# Patient Record
Sex: Male | Born: 1955
Health system: Southern US, Community
[De-identification: ages and names within clinical notes are randomized; demographics above are authoritative.]

## PROBLEM LIST (undated history)

## (undated) DIAGNOSIS — G4700 Insomnia, unspecified: Secondary | ICD-10-CM

## (undated) DIAGNOSIS — N182 Chronic kidney disease, stage 2 (mild): Secondary | ICD-10-CM

## (undated) DIAGNOSIS — N529 Male erectile dysfunction, unspecified: Secondary | ICD-10-CM

## (undated) DIAGNOSIS — R0683 Snoring: Secondary | ICD-10-CM

## (undated) DIAGNOSIS — I10 Essential (primary) hypertension: Secondary | ICD-10-CM

## (undated) DIAGNOSIS — G4730 Sleep apnea, unspecified: Secondary | ICD-10-CM

## (undated) DIAGNOSIS — T7840XA Allergy, unspecified, initial encounter: Secondary | ICD-10-CM

## (undated) DIAGNOSIS — M1990 Unspecified osteoarthritis, unspecified site: Secondary | ICD-10-CM

## (undated) HISTORY — PX: TONSILLECTOMY: SUR1361

## (undated) HISTORY — DX: Essential (primary) hypertension: I10

## (undated) HISTORY — DX: Allergy, unspecified, initial encounter: T78.40XA

## (undated) HISTORY — DX: Male erectile dysfunction, unspecified: N52.9

## (undated) HISTORY — PX: OTHER SURGICAL HISTORY: SHX169

## (undated) HISTORY — DX: Insomnia, unspecified: G47.00

## (undated) HISTORY — DX: Chronic kidney disease, stage 2 (mild): N18.2

## (undated) HISTORY — DX: Snoring: R06.83

## (undated) HISTORY — PX: POLYPECTOMY: SHX149

## (undated) HISTORY — PX: KNEE ARTHROSCOPY: SHX127

## (undated) HISTORY — DX: Unspecified osteoarthritis, unspecified site: M19.90

## (undated) HISTORY — PX: COLONOSCOPY: SHX174

## (undated) HISTORY — DX: Sleep apnea, unspecified: G47.30

---

## 2008-11-22 ENCOUNTER — Ambulatory Visit: Payer: Self-pay | Admitting: Gastroenterology

## 2008-12-06 ENCOUNTER — Ambulatory Visit: Payer: Self-pay | Admitting: Gastroenterology

## 2008-12-06 ENCOUNTER — Encounter: Payer: Self-pay | Admitting: Gastroenterology

## 2008-12-11 ENCOUNTER — Encounter: Payer: Self-pay | Admitting: Gastroenterology

## 2009-04-22 ENCOUNTER — Encounter: Admission: RE | Admit: 2009-04-22 | Discharge: 2009-04-22 | Payer: Self-pay | Admitting: Occupational Medicine

## 2010-10-01 ENCOUNTER — Other Ambulatory Visit: Payer: Self-pay | Admitting: Family Medicine

## 2011-10-28 ENCOUNTER — Ambulatory Visit (INDEPENDENT_AMBULATORY_CARE_PROVIDER_SITE_OTHER): Payer: 59 | Admitting: Family Medicine

## 2011-10-28 ENCOUNTER — Encounter: Payer: Self-pay | Admitting: Family Medicine

## 2011-10-28 ENCOUNTER — Telehealth: Payer: Self-pay

## 2011-10-28 VITALS — BP 133/83 | HR 64 | Temp 97.2°F | Resp 16 | Ht 72.2 in | Wt 213.0 lb

## 2011-10-28 DIAGNOSIS — Z23 Encounter for immunization: Secondary | ICD-10-CM

## 2011-10-28 DIAGNOSIS — Z0000 Encounter for general adult medical examination without abnormal findings: Secondary | ICD-10-CM

## 2011-10-28 DIAGNOSIS — M25561 Pain in right knee: Secondary | ICD-10-CM

## 2011-10-28 DIAGNOSIS — N529 Male erectile dysfunction, unspecified: Secondary | ICD-10-CM

## 2011-10-28 LAB — POCT URINALYSIS DIPSTICK
Blood, UA: NEGATIVE
Glucose, UA: NEGATIVE
Ketones, UA: NEGATIVE
Leukocytes, UA: NEGATIVE
Nitrite, UA: NEGATIVE
Protein, UA: 30
Spec Grav, UA: >=1.03
Urobilinogen, UA: 0.2
pH, UA: 5.5

## 2011-10-28 LAB — COMPREHENSIVE METABOLIC PANEL
ALT: 22 U/L (ref 0–53)
AST: 32 U/L (ref 0–37)
Albumin: 4.8 g/dL (ref 3.5–5.2)
Alkaline Phosphatase: 46 U/L (ref 39–117)
BUN: 21 mg/dL (ref 6–23)
CO2: 24 mEq/L (ref 19–32)
Calcium: 9.1 mg/dL (ref 8.4–10.5)
Chloride: 108 mEq/L (ref 96–112)
Creat: 1.41 mg/dL — ABNORMAL HIGH (ref 0.50–1.35)
Glucose, Bld: 103 mg/dL — ABNORMAL HIGH (ref 70–99)
Potassium: 4.5 mEq/L (ref 3.5–5.3)
Sodium: 142 mEq/L (ref 135–145)
Total Bilirubin: 0.5 mg/dL (ref 0.3–1.2)
Total Protein: 6.8 g/dL (ref 6.0–8.3)

## 2011-10-28 LAB — IFOBT (OCCULT BLOOD): IFOBT: NEGATIVE

## 2011-10-28 LAB — CBC
HCT: 47.5 % (ref 39.0–52.0)
Hemoglobin: 16 g/dL (ref 13.0–17.0)
MCH: 29.8 pg (ref 26.0–34.0)
MCHC: 33.7 g/dL (ref 30.0–36.0)
MCV: 88.5 fL (ref 78.0–100.0)
Platelets: 285 10*3/uL (ref 150–400)
RBC: 5.37 MIL/uL (ref 4.22–5.81)
RDW: 13 % (ref 11.5–15.5)
WBC: 7.3 10*3/uL (ref 4.0–10.5)

## 2011-10-28 LAB — POCT UA - MICROSCOPIC ONLY
Casts, Ur, LPF, POC: NEGATIVE
Crystals, Ur, HPF, POC: NEGATIVE
Yeast, UA: NEGATIVE

## 2011-10-28 LAB — LIPID PANEL
Cholesterol: 176 mg/dL (ref 0–200)
HDL: 54 mg/dL (ref >39)
LDL Cholesterol: 107 mg/dL — ABNORMAL HIGH (ref 0–99)
Total CHOL/HDL Ratio: 3.3 Ratio
Triglycerides: 76 mg/dL (ref <150)
VLDL: 15 mg/dL (ref 0–40)

## 2011-10-28 LAB — PSA: PSA: 0.73 ng/mL (ref <=4.00)

## 2011-10-28 MED ORDER — TETANUS-DIPHTH-ACELL PERTUSSIS 5-2.5-18.5 LF-MCG/0.5 IM SUSP: 0.5000 mL | Freq: Once | INTRAMUSCULAR | Status: DC

## 2011-10-28 MED ORDER — METHYLPREDNISOLONE 2 MG PO TABS: ORAL_TABLET | ORAL | Status: DC

## 2011-10-28 MED ORDER — TADALAFIL 20 MG PO TABS: 10.0000 mg | ORAL_TABLET | ORAL | Status: DC | PRN

## 2011-10-28 NOTE — Telephone Encounter (Signed)
Pharmacy called stating the methyprednisolone only comes in 4 mg. Dr. Milus Glazier had previously written for 2 mg tabs 1 po bid with 10 tabs being given. We will change this to the 4 mg tabs with the sig being 1 po daily with number 5 given. Please call the pharmacy to make this change.   Eula Listen, PA-C 10/28/2011 8:07 PM

## 2011-10-28 NOTE — Progress Notes (Signed)
Addended by: Tilman Neat on: 10/28/2011 02:26 PM   Modules accepted: Orders

## 2011-10-28 NOTE — Telephone Encounter (Signed)
Can you please advise on this

## 2011-10-28 NOTE — Patient Instructions (Signed)
Health Maintenance, 18- to 58-Year-Old SCHOOL PERFORMANCE After high school completion, the young adult may be attending college, technical or vocational school, or entering the military or the work force. SOCIAL AND EMOTIONAL DEVELOPMENT The young adult establishes adult relationships and explores sexual identity. Young adults may be living at home or in a college dorm or apartment. Increasing independence is important with young adults. Throughout adolescence, teens should assume responsibility of their own health care. IMMUNIZATIONS Most young adults should be fully vaccinated. A booster dose of Tdap (tetanus, diphtheria, and pertussis, or "whooping cough"), a dose of meningococcal vaccine to protect against a certain type of bacterial meningitis, hepatitis A, human papillomarvirus (HPV), chickenpox, or measles vaccines may be indicated, if not given at an earlier age. Annual influenza or "flu" vaccination should be considered during flu season.  TESTING Annual screening for vision and hearing problems is recommended. Vision should be screened objectively at least once between 18 and 58 years of age. The young adult may be screened for anemia or tuberculosis. Young adults should have a blood test to check for high cholesterol during this time period. Young adults should be screened for use of alcohol and drugs. If the young adult is sexually active, screening for sexually transmitted infections, pregnancy, or HIV may be performed. Screening for cervical cancer should be performed within 3 years of beginning sexual activity. NUTRITION AND ORAL HEALTH  Adequate calcium intake is important. Consume 3 servings of low-fat milk and dairy products daily. For those who do not drink milk or consume dairy products, calcium enriched foods, such as juice, bread, or cereal, dark, leafy greens, or canned fish are alternate sources of calcium.   Drink plenty of water. Limit fruit juice to 8 to 12 ounces per day.  Avoid sugary beverages or sodas.   Discourage skipping meals, especially breakfast. Teens should eat a good variety of vegetables and fruits, as well as lean meats.   Avoid high fat, high salt, and high sugar foods, such as candy, chips, and cookies.   Encourage young adults to participate in meal planning and preparation.   Eat meals together as a family whenever possible. Encourage conversation at mealtime.   Limit fast food choices and eating out at restaurants.   Brush teeth twice a day and floss.   Schedule dental exams twice a year.  SLEEP Regular sleep habits are important. PHYSICAL, SOCIAL, AND EMOTIONAL DEVELOPMENT  One hour of regular physical activity daily is recommended. Continue to participate in sports.   Encourage young adults to develop their own interests and consider community service or volunteerism.   Provide guidance to the young adult in making decisions about college and work plans.   Make sure that young adults know that they should never be in a situation that makes them uncomfortable, and they should tell partners if they do not want to engage in sexual activity.   Talk to the young adult about body image. Eating disorders may be noted at this time. Young adults may also be concerned about being overweight. Monitor the young adult for weight gain or loss.   Mood disturbances, depression, anxiety, alcoholism, or attention problems may be noted in young adults. Talk to the caregiver if there are concerns about mental illness.   Negotiate limit setting and independent decision making.   Encourage the young adult to handle conflict without physical violence.   Avoid loud noises which may impair hearing.   Limit television and computer time to 2 hours per day.   Individuals who engage in excessive sedentary activity are more likely to become overweight.  RISK BEHAVIORS  Sexually active young adults need to take precautions against pregnancy and sexually  transmitted infections. Talk to young adults about contraception.   Provide a tobacco-free and drug-free environment for the young adult. Talk to the young adult about drug, tobacco, and alcohol use among friends or at friends' homes. Make sure the young adult knows that smoking tobacco or marijuana and taking drugs have health consequences and may impact brain development.   Teach the young adult about appropriate use of over-the-counter or prescription medicines.   Establish guidelines for driving and for riding with friends.   Talk to young adults about the risks of drinking and driving or boating. Encourage the young adult to call you if he or she or friends have been drinking or using drugs.   Remind young adults to wear seat belts at all times in cars and life vests in boats.   Young adults should always wear a properly fitted helmet when they are riding a bicycle.   Use caution with all-terrain vehicles (ATVs) or other motorized vehicles.   Do not keep handguns in the home. (If you do, the gun and ammunition should be locked separately and out of the young adult's access.)   Equip your home with smoke detectors and change the batteries regularly. Make sure all family members know the fire escape plans for your home.   Teach young adults not to swim alone and not to dive in shallow water.   All individuals should wear sunscreen that protects against UVA and UVB light with at least a sun protection factor (SPF) of 30 when out in the sun. This minimizes sun burning.  WHAT'S NEXT? Young adults should visit their pediatrician or family physician yearly. By young adulthood, health care should be transitioned to a family physician or internal medicine specialist. Sexually active females may want to begin annual physical exams with a gynecologist. Document Released: 09/16/2006 Document Revised: 06/10/2011 Document Reviewed: 10/06/2006 [employer] Patient Information 2012 [employer]. 

## 2011-10-28 NOTE — Telephone Encounter (Signed)
Samantha from [facility] is calling about pt rx for methylpredisone 2mg - it only comes in 4mg  and they also need to know the dosage for pt since it only comes in 4mg  please contact [phone] ext [phone] pt was seen in office today by Dr Milus Glazier

## 2011-10-28 NOTE — Telephone Encounter (Signed)
Addended by: Sondra Barges on: 10/28/2011 08:09 PM   Modules accepted: Orders

## 2011-10-28 NOTE — Progress Notes (Signed)
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  He lives with his wife.   Only complaint today is lateral right patella pain when kneeling on right knee. He probably had with his thumbs last year has clearedand part-time work and being more active. The Voltaren she'll also did help with joint pain.  Last tdap: ? Interested in shingles vaccine since father had bad case of shingles Last colonoscopy:  2010  PMHX:  Reviewed:  Quit cigs 2000 ROS:  Neg F/Hx: Mother had cancer but was heavy smoker  Objective: Very healthy-appearing well-developed middle-aged man in no distress  Eyes: Normal funduscopic, normal EOM, pupils equal and reactive light and accommodation  TMs: normal with normal external ears  Oropharynx: Normal inspection and palpation  Neck: No bruits, no thyromegaly, no adenopathy, supple  Chest: Clear  Heart: Regular no murmur or gallop  Abdomen: Soft nontender without HSM or masses  Adenopathy: None-axilla, neck, supraclavicular, carotids checked  Genitalia: Normal circumcised male  Rectal: Normal prostate without nodules, few external anal tags  Extremities: Mild tenderness lateral right patella with full range of motion of all joints otherwise.  Assessment exceptionally healthy adult male with mild erectile dysfunction and recent patellar tendinitis  Plan: Refill Cialis, Medrol 4 mg daily x5  Check routine labs

## 2011-10-29 NOTE — Telephone Encounter (Signed)
Spoke w/pharmacist and changed strength/sig/# for Rx as written by Alycia Rossetti

## 2012-02-03 ENCOUNTER — Ambulatory Visit (INDEPENDENT_AMBULATORY_CARE_PROVIDER_SITE_OTHER): Payer: 59 | Admitting: Internal Medicine

## 2012-02-03 VITALS — BP 160/100 | HR 73 | Temp 98.3°F | Resp 16 | Ht 73.5 in | Wt 220.4 lb

## 2012-02-03 DIAGNOSIS — I10 Essential (primary) hypertension: Secondary | ICD-10-CM

## 2012-02-03 DIAGNOSIS — G4700 Insomnia, unspecified: Secondary | ICD-10-CM | POA: Insufficient documentation

## 2012-02-03 DIAGNOSIS — N289 Disorder of kidney and ureter, unspecified: Secondary | ICD-10-CM | POA: Insufficient documentation

## 2012-02-03 MED ORDER — ZOLPIDEM TARTRATE 10 MG PO TABS: 10.0000 mg | ORAL_TABLET | Freq: Every evening | ORAL | Status: DC | PRN

## 2012-02-03 MED ORDER — LISINOPRIL 10 MG PO TABS: 10.0000 mg | ORAL_TABLET | Freq: Every day | ORAL | Status: DC

## 2012-02-03 NOTE — Progress Notes (Signed)
  Subjective:    Patient ID: Jorge Barker, male    DOB: Oct 02, 1955, 56 y.o.   MRN: [id number]  HPI BP elevated Needs DOT  Cpe and labs reviewed and all ok Review of Systems     Objective:   Physical Exam  Constitutional: He is oriented to person, place, and time. He appears well-developed and well-nourished.  HENT:  Right Ear: External ear normal.  Left Ear: External ear normal.  Nose: Nose normal.  Mouth/Throat: Oropharynx is clear and moist.  Eyes: EOM are normal. Pupils are equal, round, and reactive to light.  Neck: Normal range of motion. Neck supple. No thyromegaly present.  Cardiovascular: Normal rate, regular rhythm and normal heart sounds.   Pulmonary/Chest: Effort normal.  Abdominal: Soft.  Musculoskeletal: Normal range of motion.  Lymphadenopathy:    He has no cervical adenopathy.  Neurological: He is alert and oriented to person, place, and time. Coordination normal.  Skin: Skin is warm and dry.  Psychiatric: He has a normal mood and affect. His behavior is normal. Judgment and thought content normal.   164/90  156/104       Assessment & Plan:  HBP tx started

## 2012-02-03 NOTE — Patient Instructions (Signed)
Insomnia Insomnia is frequent trouble falling and/or staying asleep. Insomnia can be a long term problem or a short term problem. Both are common. Insomnia can be a short term problem when the wakefulness is related to a certain stress or worry. Long term insomnia is often related to ongoing stress during waking hours and/or poor sleeping habits. Overtime, sleep deprivation itself can make the problem worse. Every little thing feels more severe because you are overtired and your ability to cope is decreased. CAUSES   Stress, anxiety, and depression.   Poor sleeping habits.   Distractions such as TV in the bedroom.   Naps close to bedtime.   Engaging in emotionally charged conversations before bed.   Technical reading before sleep.   Alcohol and other sedatives. They may make the problem worse. They can hurt normal sleep patterns and normal dream activity.   Stimulants such as caffeine for several hours prior to bedtime.   Pain syndromes and shortness of breath can cause insomnia.   Exercise late at night.   Changing time zones may cause sleeping problems (jet lag).  It is sometimes helpful to have someone observe your sleeping patterns. They should look for periods of not breathing during the night (sleep apnea). They should also look to see how long those periods last. If you live alone or observers are uncertain, you can also be observed at a sleep clinic where your sleep patterns will be professionally monitored. Sleep apnea requires a checkup and treatment. Give your caregivers your medical history. Give your caregivers observations your family has made about your sleep.  SYMPTOMS   Not feeling rested in the morning.   Anxiety and restlessness at bedtime.   Difficulty falling and staying asleep.  TREATMENT   Your caregiver may prescribe treatment for an underlying medical disorders. Your caregiver can give advice or help if you are using alcohol or other drugs for  self-medication. Treatment of underlying problems will usually eliminate insomnia problems.   Medications can be prescribed for short time use. They are generally not recommended for lengthy use.   Over-the-counter sleep medicines are not recommended for lengthy use. They can be habit forming.   You can promote easier sleeping by making lifestyle changes such as:   Using relaxation techniques that help with breathing and reduce muscle tension.   Exercising earlier in the day.   Changing your diet and the time of your last meal. No night time snacks.   Establish a regular time to go to bed.   Counseling can help with stressful problems and worry.   Soothing music and white noise may be helpful if there are background noises you cannot remove.   Stop tedious detailed work at least one hour before bedtime.  HOME CARE INSTRUCTIONS   Keep a diary. Inform your caregiver about your progress. This includes any medication side effects. See your caregiver regularly. Take note of:   Times when you are asleep.   Times when you are awake during the night.   The quality of your sleep.   How you feel the next day.  This information will help your caregiver care for you.  Get out of bed if you are still awake after 15 minutes. Read or do some quiet activity. Keep the lights down. Wait until you feel sleepy and go back to bed.   Keep regular sleeping and waking hours. Avoid naps.   Exercise regularly.   Avoid distractions at bedtime. Distractions include watching television or engaging   in any intense or detailed activity like attempting to balance the household checkbook.   Develop a bedtime ritual. Keep a familiar routine of bathing, brushing your teeth, climbing into bed at the same time each night, listening to soothing music. Routines increase the success of falling to sleep faster.   Use relaxation techniques. This can be using breathing and muscle tension release routines. It can  also include visualizing peaceful scenes. You can also help control troubling or intruding thoughts by keeping your mind occupied with boring or repetitive thoughts like the old concept of counting sheep. You can make it more creative like imagining planting one beautiful flower after another in your backyard garden.   During your day, work to eliminate stress. When this is not possible use some of the previous suggestions to help reduce the anxiety that accompanies stressful situations.  MAKE SURE YOU:   Understand these instructions.   Will watch your condition.   Will get help right away if you are not doing well or get worse.  Document Released: 06/18/2000 Document Revised: 06/10/2011 Document Reviewed: 07/19/2007 [facility] Patient Information 2012 [place], [place].Hypertension As your heart beats, it forces blood through your arteries. This force is your blood pressure. If the pressure is too high, it is called hypertension (HTN) or high blood pressure. HTN is dangerous because you may have it and not know it. High blood pressure may mean that your heart has to work harder to pump blood. Your arteries may be narrow or stiff. The extra work puts you at risk for heart disease, stroke, and other problems.  Blood pressure consists of two numbers, a higher number over a lower, 110/72, for example. It is stated as "110 over 72." The ideal is below 120 for the top number (systolic) and under 80 for the bottom (diastolic). Write down your blood pressure today. You should pay close attention to your blood pressure if you have certain conditions such as:  Heart failure.   Prior heart attack.   Diabetes   Chronic kidney disease.   Prior stroke.   Multiple risk factors for heart disease.  To see if you have HTN, your blood pressure should be measured while you are seated with your arm held at the level of the heart. It should be measured at least twice. A one-time elevated blood pressure reading  (especially in the Emergency Department) does not mean that you need treatment. There may be conditions in which the blood pressure is different between your right and left arms. It is important to see your caregiver soon for a recheck. Most people have essential hypertension which means that there is not a specific cause. This type of high blood pressure may be lowered by changing lifestyle factors such as:  Stress.   Smoking.   Lack of exercise.   Excessive weight.   Drug/tobacco/alcohol use.   Eating less salt.  Most people do not have symptoms from high blood pressure until it has caused damage to the body. Effective treatment can often prevent, delay or reduce that damage. TREATMENT  When a cause has been identified, treatment for high blood pressure is directed at the cause. There are a large number of medications to treat HTN. These fall into several categories, and your caregiver will help you select the medicines that are best for you. Medications may have side effects. You should review side effects with your caregiver. If your blood pressure stays high after you have made lifestyle changes or started on medicines,  Your medication(s) may need to be changed.   Other problems may need to be addressed.   Be certain you understand your prescriptions, and know how and when to take your medicine.   Be sure to follow up with your caregiver within the time frame advised (usually within two weeks) to have your blood pressure rechecked and to review your medications.   If you are taking more than one medicine to lower your blood pressure, make sure you know how and at what times they should be taken. Taking two medicines at the same time can result in blood pressure that is too low.  SEEK IMMEDIATE MEDICAL CARE IF:  You develop a severe headache, blurred or changing vision, or confusion.   You have unusual weakness or numbness, or a faint feeling.   You have severe chest or  abdominal pain, vomiting, or breathing problems.  MAKE SURE YOU:   Understand these instructions.   Will watch your condition.   Will get help right away if you are not doing well or get worse.  Document Released: 06/21/2005 Document Revised: 06/10/2011 Document Reviewed: 02/09/2008 [facility] Patient Information 2012 [place], [place].

## 2013-03-08 ENCOUNTER — Encounter: Payer: Self-pay | Admitting: Family Medicine

## 2013-03-08 ENCOUNTER — Ambulatory Visit (INDEPENDENT_AMBULATORY_CARE_PROVIDER_SITE_OTHER): Payer: 59 | Admitting: Family Medicine

## 2013-03-08 VITALS — BP 144/100 | HR 67 | Temp 98.4°F | Resp 16 | Ht 73.0 in | Wt 219.4 lb

## 2013-03-08 DIAGNOSIS — H60509 Unspecified acute noninfective otitis externa, unspecified ear: Secondary | ICD-10-CM

## 2013-03-08 DIAGNOSIS — G4700 Insomnia, unspecified: Secondary | ICD-10-CM

## 2013-03-08 DIAGNOSIS — R0609 Other forms of dyspnea: Secondary | ICD-10-CM

## 2013-03-08 DIAGNOSIS — N529 Male erectile dysfunction, unspecified: Secondary | ICD-10-CM

## 2013-03-08 DIAGNOSIS — R0989 Other specified symptoms and signs involving the circulatory and respiratory systems: Secondary | ICD-10-CM

## 2013-03-08 DIAGNOSIS — R0683 Snoring: Secondary | ICD-10-CM

## 2013-03-08 DIAGNOSIS — H60543 Acute eczematoid otitis externa, bilateral: Secondary | ICD-10-CM

## 2013-03-08 DIAGNOSIS — Z0000 Encounter for general adult medical examination without abnormal findings: Secondary | ICD-10-CM

## 2013-03-08 LAB — CBC WITH DIFFERENTIAL/PLATELET
Basophils Absolute: 0 10*3/uL (ref 0.0–0.1)
Basophils Relative: 0 % (ref 0–1)
Eosinophils Absolute: 0.1 10*3/uL (ref 0.0–0.7)
Eosinophils Relative: 2 % (ref 0–5)
HCT: 45.3 % (ref 39.0–52.0)
Hemoglobin: 16.1 g/dL (ref 13.0–17.0)
Lymphocytes Relative: 25 % (ref 12–46)
Lymphs Abs: 1.7 10*3/uL (ref 0.7–4.0)
MCH: 30.1 pg (ref 26.0–34.0)
MCHC: 35.5 g/dL (ref 30.0–36.0)
MCV: 84.7 fL (ref 78.0–100.0)
Monocytes Absolute: 0.7 10*3/uL (ref 0.1–1.0)
Monocytes Relative: 10 % (ref 3–12)
Neutro Abs: 4.4 10*3/uL (ref 1.7–7.7)
Neutrophils Relative %: 63 % (ref 43–77)
Platelets: 291 10*3/uL (ref 150–400)
RBC: 5.35 MIL/uL (ref 4.22–5.81)
RDW: 14.1 % (ref 11.5–15.5)
WBC: 7.1 10*3/uL (ref 4.0–10.5)

## 2013-03-08 LAB — PSA: PSA: 1.05 ng/mL (ref <=4.00)

## 2013-03-08 LAB — COMPREHENSIVE METABOLIC PANEL
ALT: 38 U/L (ref 0–53)
AST: 74 U/L — ABNORMAL HIGH (ref 0–37)
Albumin: 4.8 g/dL (ref 3.5–5.2)
Alkaline Phosphatase: 53 U/L (ref 39–117)
BUN: 15 mg/dL (ref 6–23)
CO2: 29 mEq/L (ref 19–32)
Calcium: 9.8 mg/dL (ref 8.4–10.5)
Chloride: 104 mEq/L (ref 96–112)
Creat: 1.19 mg/dL (ref 0.50–1.35)
Glucose, Bld: 103 mg/dL — ABNORMAL HIGH (ref 70–99)
Potassium: 4.4 mEq/L (ref 3.5–5.3)
Sodium: 139 mEq/L (ref 135–145)
Total Bilirubin: 1 mg/dL (ref 0.3–1.2)
Total Protein: 6.9 g/dL (ref 6.0–8.3)

## 2013-03-08 LAB — POCT URINALYSIS DIPSTICK
Bilirubin, UA: NEGATIVE
Blood, UA: NEGATIVE
Glucose, UA: NEGATIVE
Ketones, UA: NEGATIVE
Leukocytes, UA: NEGATIVE
Nitrite, UA: NEGATIVE
Protein, UA: NEGATIVE
Spec Grav, UA: 1.02
Urobilinogen, UA: 0.2
pH, UA: 5.5

## 2013-03-08 LAB — LIPID PANEL
Cholesterol: 157 mg/dL (ref 0–200)
HDL: 45 mg/dL (ref >39)
LDL Cholesterol: 83 mg/dL (ref 0–99)
Total CHOL/HDL Ratio: 3.5 Ratio
Triglycerides: 144 mg/dL (ref <150)
VLDL: 29 mg/dL (ref 0–40)

## 2013-03-08 LAB — IFOBT (OCCULT BLOOD): IFOBT: NEGATIVE

## 2013-03-08 LAB — TSH: TSH: 1.78 u[IU]/mL (ref 0.350–4.500)

## 2013-03-08 MED ORDER — TADALAFIL 20 MG PO TABS: 10.0000 mg | ORAL_TABLET | ORAL | Status: DC | PRN

## 2013-03-08 MED ORDER — ZOLPIDEM TARTRATE 10 MG PO TABS: 10.0000 mg | ORAL_TABLET | Freq: Every evening | ORAL | Status: DC | PRN

## 2013-03-08 MED ORDER — TRIAMCINOLONE ACETONIDE 0.1 % EX CREA: TOPICAL_CREAM | CUTANEOUS | Status: DC

## 2013-03-08 NOTE — Patient Instructions (Addendum)
Health Maintenance, Males A healthy lifestyle and preventative care can promote health and wellness.  Maintain regular health, dental, and eye exams.  Eat a healthy diet. Foods like vegetables, fruits, whole grains, low-fat dairy products, and lean protein foods contain the nutrients you need without too many calories. Decrease your intake of foods high in solid fats, added sugars, and salt. Get information about a proper diet from your caregiver, if necessary.  Regular physical exercise is one of the most important things you can do for your health. Most adults should get at least 150 minutes of moderate-intensity exercise (any activity that increases your heart rate and causes you to sweat) each week. In addition, most adults need muscle-strengthening exercises on 2 or more days a week.   Maintain a healthy weight. The body mass index (BMI) is a screening tool to identify possible weight problems. It provides an estimate of body fat based on height and weight. Your caregiver can help determine your BMI, and can help you achieve or maintain a healthy weight. For adults 20 years and older:  A BMI below 18.5 is considered underweight.  A BMI of 18.5 to 24.9 is normal.  A BMI of 25 to 29.9 is considered overweight.  A BMI of 30 and above is considered obese.  Maintain normal blood lipids and cholesterol by exercising and minimizing your intake of saturated fat. Eat a balanced diet with plenty of fruits and vegetables. Blood tests for lipids and cholesterol should begin at age 20 and be repeated every 5 years. If your lipid or cholesterol levels are high, you are over 50, or you are a high risk for heart disease, you may need your cholesterol levels checked more frequently.Ongoing high lipid and cholesterol levels should be treated with medicines, if diet and exercise are not effective.  If you smoke, find out from your caregiver how to quit. If you do not use tobacco, do not start.  If you  choose to drink alcohol, do not exceed 2 drinks per day. One drink is considered to be 12 ounces (355 mL) of beer, 5 ounces (148 mL) of wine, or 1.5 ounces (44 mL) of liquor.  Avoid use of street drugs. Do not share needles with anyone. Ask for help if you need support or instructions about stopping the use of drugs.  High blood pressure causes heart disease and increases the risk of stroke. Blood pressure should be checked at least every 1 to 2 years. Ongoing high blood pressure should be treated with medicines if weight loss and exercise are not effective.  If you are 45 to 59 years old, ask your caregiver if you should take aspirin to prevent heart disease.  Diabetes screening involves taking a blood sample to check your fasting blood sugar level. This should be done once every 3 years, after age 45, if you are within normal weight and without risk factors for diabetes. Testing should be considered at a younger age or be carried out more frequently if you are overweight and have at least 1 risk factor for diabetes.  Colorectal cancer can be detected and often prevented. Most routine colorectal cancer screening begins at the age of 50 and continues through age 75. However, your caregiver may recommend screening at an earlier age if you have risk factors for colon cancer. On a yearly basis, your caregiver may provide home test kits to check for hidden blood in the stool. Use of a small camera at the end of a tube,   to directly examine the colon (sigmoidoscopy or colonoscopy), can detect the earliest forms of colorectal cancer. Talk to your caregiver about this at age 50, when routine screening begins. Direct examination of the colon should be repeated every 5 to 10 years through age 75, unless early forms of pre-cancerous polyps or small growths are found.  Hepatitis C blood testing is recommended for all people born from 1945 through 1965 and any individual with known risks for hepatitis C.  Healthy  men should no longer receive prostate-specific antigen (PSA) blood tests as part of routine cancer screening. Consult with your caregiver about prostate cancer screening.  Testicular cancer screening is not recommended for adolescents or adult males who have no symptoms. Screening includes self-exam, caregiver exam, and other screening tests. Consult with your caregiver about any symptoms you have or any concerns you have about testicular cancer.  Practice safe sex. Use condoms and avoid high-risk sexual practices to reduce the spread of sexually transmitted infections (STIs).  Use sunscreen with a sun protection factor (SPF) of 30 or greater. Apply sunscreen liberally and repeatedly throughout the day. You should seek shade when your shadow is shorter than you. Protect yourself by wearing long sleeves, pants, a wide-brimmed hat, and sunglasses year round, whenever you are outdoors.  Notify your caregiver of new moles or changes in moles, especially if there is a change in shape or color. Also notify your caregiver if a mole is larger than the size of a pencil eraser.  A one-time screening for abdominal aortic aneurysm (AAA) and surgical repair of large AAAs by sound wave imaging (ultrasonography) is recommended for ages 65 to 75 years who are current or former smokers.  Stay current with your immunizations. Document Released: 12/18/2007 Document Revised: 09/13/2011 Document Reviewed: 11/16/2010 [employer] Patient Information 2014 [employer].  

## 2013-03-08 NOTE — Progress Notes (Signed)
  Subjective:    Patient ID: Jorge Barker, male    DOB: 1956/01/25, 57 y.o.   MRN: [id number]  HPI Sleep is a problem.  Snoring with observed apneic spells.  Feels tired during the day. Walking 3 miles a day. 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 Married.  3rd grandchild in July  Review of Systems  Constitutional: Negative.   HENT: Negative.   Eyes: Negative.   Respiratory: Negative.   Cardiovascular: Negative.   Gastrointestinal: Negative.   Endocrine: Negative.   Genitourinary: Negative.   Musculoskeletal: Negative.   Skin: Negative.   Allergic/Immunologic: Negative.   Neurological: Negative.   Hematological: Negative.   Psychiatric/Behavioral: Negative.        Objective:   Physical Exam160/100 NAD, appears fit HEENT:  Normal with multiple crowns Neck:  Supple, no adenop, no bruit, no thyromegaly Chest:  Clear, non tender Heart: reg, no murmur, no gallop Abdomen:  Soft, nontender with no HSM Genitalia:  Normal Rectal:  Normal Extremities: normal pulses, normal appearance with no edema Skin:  clear     Assessment & Plan:  Annual physical exam - Plan: CBC with Differential, Comprehensive metabolic panel, POCT urinalysis dipstick, Lipid panel, PSA, TSH, IFOBT POC (occult bld, rslt in office), Varicella-zoster vaccine subcutaneous  Snoring - Plan: Nocturnal polysomnography (NPSG), zolpidem (AMBIEN) 10 MG tablet  Insomnia - Plan: zolpidem (AMBIEN) 10 MG tablet  Erectile dysfunction - Plan: tadalafil (CIALIS) 20 MG tablet  Eczema of external ear, bilateral - Plan: triamcinolone cream (KENALOG) 0.1 %  Signed, Elvina Sidle, MD

## 2013-03-12 ENCOUNTER — Telehealth: Payer: Self-pay | Admitting: Radiology

## 2013-03-12 ENCOUNTER — Telehealth: Payer: Self-pay | Admitting: Neurology

## 2013-03-12 ENCOUNTER — Other Ambulatory Visit: Payer: Self-pay | Admitting: Neurology

## 2013-03-12 ENCOUNTER — Encounter: Payer: Self-pay | Admitting: Family Medicine

## 2013-03-12 DIAGNOSIS — R0683 Snoring: Secondary | ICD-10-CM

## 2013-03-12 DIAGNOSIS — R5381 Other malaise: Secondary | ICD-10-CM

## 2013-03-12 DIAGNOSIS — G4710 Hypersomnia, unspecified: Secondary | ICD-10-CM

## 2013-03-12 NOTE — Telephone Encounter (Signed)
refers patient for attended sleep study: Dr. Milus Glazier   Height: 6'1  Weight: 219 lb   BMI: 28.95  Past Medical History: Hypertension, Renal insufficiency, Snoring, Erectile dysfunction and Eczema of external ear, bilateral  Sleep Symptoms: Snoring with observed apneic spells and feels tired during the day  Epworth Score: 12. Patient stated after he has lunch, he would go to his den and sit and if there's music playing he would doze off for 45 minutes  Medications: Glucosamine-Chondroitin GLUCOSAMINE-CHONDROITIN PO Take by mouth daily. Lisinopril (Tab) PRINIVIL,ZESTRIL 10 MG Take 1 tablet (10 mg total) by mouth daily. Omega-3 Fatty Acids (Cap) fish oil-omega-3 fatty acids 1000 MG Take 1 g by mouth daily. Tadalafil (Tab) CIALIS 20 MG Take 0.5-1 tablets (10-20 mg total) by mouth every other day as needed for erectile dysfunction. Triamcinolone Acetonide (Cream) KENALOG 0.1 % Twice daily prn ear itching Zinc (Tab) Zinc 50 MG Take by mouth daily. Zolpidem Tartrate (Tab) AMBIEN 10 MG Take 1 tablet (10 mg total) by mouth at bedtime as needed for sleep.   Insurance: [employer] will cover at 100% but authorization is required.  Please review patient information and submit instructions for scheduling and orders for sleep technologist.  Thank you!

## 2013-03-12 NOTE — Telephone Encounter (Signed)
[facility] has gotten order faxed for sleep study, but this is not in epic, have put in epic. Unclear how order was sent without being put in epic.

## 2013-03-12 NOTE — Telephone Encounter (Signed)
This patient qualifies for attended sleep study based on clinical history. Overweight, no morbidly obese. HTN , ED, insomnia.   Patient will need a SPLIT at AHI 15 and  3% scoring.  patient to take their own sleep aid if they have oe.    CO2 necessary.

## 2013-03-14 NOTE — Telephone Encounter (Signed)
I called and spoke with the patient and he will callback this morning to schedule his sleep study.

## 2013-03-27 ENCOUNTER — Telehealth: Payer: Self-pay | Admitting: Family Medicine

## 2013-03-27 NOTE — Telephone Encounter (Signed)
LMOM to see if patient has checked his MY CHART and read DR Lauensteins message

## 2013-04-03 DIAGNOSIS — G2589 Other specified extrapyramidal and movement disorders: Secondary | ICD-10-CM

## 2013-04-03 DIAGNOSIS — G4700 Insomnia, unspecified: Secondary | ICD-10-CM

## 2013-04-03 DIAGNOSIS — G4730 Sleep apnea, unspecified: Secondary | ICD-10-CM

## 2013-04-03 DIAGNOSIS — G4733 Obstructive sleep apnea (adult) (pediatric): Secondary | ICD-10-CM

## 2013-04-04 ENCOUNTER — Ambulatory Visit (INDEPENDENT_AMBULATORY_CARE_PROVIDER_SITE_OTHER): Payer: Self-pay | Admitting: Neurology

## 2013-04-04 DIAGNOSIS — R0683 Snoring: Secondary | ICD-10-CM

## 2013-04-04 DIAGNOSIS — G4710 Hypersomnia, unspecified: Secondary | ICD-10-CM

## 2013-04-04 DIAGNOSIS — Z0289 Encounter for other administrative examinations: Secondary | ICD-10-CM

## 2013-04-13 ENCOUNTER — Encounter: Payer: Self-pay | Admitting: *Deleted

## 2013-04-13 ENCOUNTER — Telehealth: Payer: Self-pay | Admitting: Neurology

## 2013-04-13 DIAGNOSIS — G2589 Other specified extrapyramidal and movement disorders: Secondary | ICD-10-CM

## 2013-04-13 DIAGNOSIS — G4733 Obstructive sleep apnea (adult) (pediatric): Secondary | ICD-10-CM

## 2013-04-13 DIAGNOSIS — G4700 Insomnia, unspecified: Secondary | ICD-10-CM

## 2013-04-13 NOTE — Telephone Encounter (Signed)
Called patient to discuss sleep study results.  Discussed findings, recommendations and follow up care.  Patient understood well and all questions were answered.  Patient has chosen [employer] as his DME Company. I will mail sleep study results to the patient and fax a copy to Dr. Loma Boston office.

## 2013-05-10 ENCOUNTER — Other Ambulatory Visit: Payer: Self-pay

## 2013-06-04 ENCOUNTER — Encounter: Payer: Self-pay | Admitting: Neurology

## 2013-06-04 ENCOUNTER — Ambulatory Visit (INDEPENDENT_AMBULATORY_CARE_PROVIDER_SITE_OTHER): Payer: 59 | Admitting: Neurology

## 2013-06-04 VITALS — BP 157/96 | HR 65 | Resp 16 | Ht 74.0 in | Wt 217.0 lb

## 2013-06-04 DIAGNOSIS — G4730 Sleep apnea, unspecified: Secondary | ICD-10-CM

## 2013-06-04 DIAGNOSIS — G4710 Hypersomnia, unspecified: Secondary | ICD-10-CM

## 2013-06-04 DIAGNOSIS — G4733 Obstructive sleep apnea (adult) (pediatric): Secondary | ICD-10-CM

## 2013-06-04 DIAGNOSIS — N529 Male erectile dysfunction, unspecified: Secondary | ICD-10-CM | POA: Insufficient documentation

## 2013-06-04 DIAGNOSIS — R0683 Snoring: Secondary | ICD-10-CM | POA: Insufficient documentation

## 2013-06-04 HISTORY — DX: Sleep apnea, unspecified: G47.30

## 2013-06-04 NOTE — Progress Notes (Signed)
[facility]  Provider:  Melvyn Novas, M D  Referring Provider: Elvina Sidle, MD Primary Care Physician:  Elvina Sidle, MD  Chief Complaint  Patient presents with  . New Evaluation    sleep consult, rm 11    HPI:  Jorge Barker is a 57 y.o. male  is seen here as a referral  from Dr. Milus Glazier for evaluation of insomnia and snoring.   Mr. Holeman was originally referred directly for a sleep study by his primary care physician. The concerns addressed were that of nonrestorative sleep, insomnia and hypertension. It is also important that Mr. Debord's wife reported loud snoring and witnessed apneas. 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  He has started to use power naps  After his 41 birthday to get refreshed, he couldn't functio well without this once a day 30 minute nap.  He works out regularly, works part time as a Chartered loss adjuster.  His past medical history was endorsed for hypertension, renal insufficiency, osteoarthritis, shiftworker history, insomnia with excessive daytime sleepiness and loud snoring. Witnessed apneas. The patient endorsed the Epworth sleepiness score of 16/24 points. His BMI was 28.9, his neck circumference 16.5 inches. Blood pressure measured prestudy was elevated at 165/98 mm Hg.   The sleep study was performed on 04-04-13 the baseline study showed an AHI of 27.4 and an RDI of 28.4 during REM sleep the AHI was further elevated to 35.5 events per hour of sleep interestingly supine AHI was only 27. Lowest oxygen nadir was 82% was only 7.4 minutes of desaturations there were frequent periodic limb movements but almost normal related arousals. CPAP was  initiated at 4 cm water and needed only to be increased to 6 cm water pressure unfortunately the PET patient's periodic limb movements accelerated under CPAP he was but his oxygen nadir now rose to  91%.   The patient reports that he has used CPAP at home but wakes regularly now at about 3. 30 - 4 in the morning. He is wide awake - feels ready to go .  Sometimes it very difficult for him to reinitiate sleep. 1 his wife reports that he sleeps better now and especially there is no breakthrough snoring or apnea witnessed anymore he is not sure is that he is tolerating CPAP that well. In the last month he has only taken 4 naps.  He has a runny nose when using humidity,  the mask is not bothering him and as a fireman he was used to we are respiratory gear. There is no claustrophobia. Nasal pillows were used as he has  facial hair.  The PLMs were decreased per wife report. Epworth score now 6 from previously 16 points. The patient bikes for exercise regularly and noticed over the last month an increase in exercise tolerance from about 50 miles to 20 mild biking without being exhausted. CPAP data download show that the patient has used machine 10 days with an average time in therapy 7 hours and 22 minutes, residual AHI is 0.3. There is a moderate air leak. The setting of 6 cm water pressure with a 1 cm release.    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 Wakes up in AM spontaneously- not using alarm.  Rises at 6.00 AM,  7 hours of overall sleep , used to have nocturia 3 times at night, none now.  No longer waking with a dry mouth , and as reported above he no longer needs the power naps.      Review of Systems: Out of a complete 14 system review, the patient complains of only the following symptoms, and all other reviewed systems are negative. Epworth from 16 to 6 points , CPAP at 6 cm.   History   Social  History  . Marital Status: Married    Spouse Name: N/A    Number of Children: 2  . Years of Education: assoc.   Occupational History  . drive bus part time    Social History Main Topics  . Smoking status: Former Smoker -- 12 years    Types: Cigarettes    Quit date: 07/05/1981  . Smokeless tobacco: Never Used  . Alcohol Use: Yes     Comment: drinks beer and spirit (RUM)/10oz weekly  . Drug Use: No  . Sexual Activity: Yes   Other Topics Concern  . Not on file   Social History Narrative   Married. Education: [employer]. Exercise: Walk daily 30-60 minutes. Patient is right handed and resides in home with wife.  Consumes 1-2 cups of caffiene daily.    History reviewed. No pertinent family history.  Past Medical History  Diagnosis Date  . Erectile dysfunction   . Snoring     History reviewed. No pertinent past surgical history.  Current Outpatient Prescriptions  Medication Sig Dispense Refill  . tadalafil (CIALIS) 20 MG tablet Take 0.5-1 tablets (10-20 mg total) by mouth every other day as needed for erectile dysfunction.  10 tablet  11  . triamcinolone cream (KENALOG) 0.1 % Twice daily prn ear itching  30 g  0  . zolpidem (AMBIEN) 10 MG tablet Take 1 tablet (10 mg total) by mouth at bedtime as needed for sleep.  30 tablet  1   No current facility-administered medications for this visit.    Allergies as of 06/04/2013 - Review Complete 06/04/2013  Allergen Reaction Noted  . Citalopram hydrobromide  10/28/2011  . Influenza vaccines  10/28/2011  . Oxycodone-acetaminophen  11/22/2008    Vitals: BP 157/96  Pulse 65  Resp 16  Ht 6\' 2"  (1.88 m)  Wt 217 lb (98.431 kg)  BMI 27.85 kg/m2 Last Weight:  Wt Readings from Last 1 Encounters:  06/04/13 217 lb (98.431 kg)   Last Height:   Ht Readings from Last 1 Encounters:  06/04/13 6\' 2"  (1.88 m)   Physical exam:  General: The patient is awake, alert and appears not in acute distress. The patient is well groomed. Head:  Normocephalic, atraumatic. Neck is supple. Mallampati 3  neck circumference:16.25 ,  Cardiovascular:  Regular rate and rhythm , without  murmurs or carotid bruit, and without distended neck veins. Respiratory: Lungs are clear to auscultation. Skin:  Without evidence of edema, or rash Trunk: BMI is normal.  Neurologic exam : The patient is awake and alert, oriented to place and time.  Memory subjective described as intact.  There is a normal attention span & concentration ability. Speech is fluent without  dysarthria, dysphonia or aphasia.  Mood and affect are appropriate.  Cranial nerves: Pupils are equal and briskly reactive to light. Funduscopic exam  without   evidence of pallor or edema. Extraocular movements  in vertical and horizontal planes intact and without nystagmus. Visual fields by finger perimetry are intact. Hearing to finger rub intact.  Facial sensation intact to fine touch. Facial motor strength is symmetric and tongue and uvula move midline.  Motor exam:   Normal tone and normal muscle bulk and symmetric normal strength in all extremities.  Sensory:  Fine touch, pinprick and vibration were tested in all extremities. Proprioception is normal.  Coordination: Rapid alternating movements in the fingers/hands is tested and normal. Finger-to-nose maneuver tested and normal without evidence of ataxia, dysmetria or tremor.  Gait and station: Patient walks without assistive device . Strength within normal limits. Stance is stable and normal. Tandem gait is unfragmented. Romberg testing is normal.  Deep tendon reflexes: in the  upper and lower extremities are symmetric and intact. Babinski  downgoing.   Assessment:  After physical and neurologic examination, review of laboratory studies, imaging, neurophysiology testing and pre-existing records, assessment is 1) OSA . Moderate severity - controlled on CPAP 6 cm water, nasal pillow.  2)Insomnia , nocturia and hypersomia all  resolved.   Plan:  Treatment plan and additional workup : 1) continue CPAP use , will follow in 6 month , than yearly.

## 2013-06-04 NOTE — Patient Instructions (Signed)

## 2013-06-14 ENCOUNTER — Encounter: Payer: Self-pay | Admitting: Neurology

## 2013-11-03 ENCOUNTER — Encounter: Payer: Self-pay | Admitting: Gastroenterology

## 2013-11-04 ENCOUNTER — Encounter: Payer: Self-pay | Admitting: Family Medicine

## 2013-11-05 ENCOUNTER — Other Ambulatory Visit: Payer: Self-pay | Admitting: Family Medicine

## 2013-11-05 DIAGNOSIS — G4700 Insomnia, unspecified: Secondary | ICD-10-CM

## 2013-11-05 DIAGNOSIS — R0683 Snoring: Secondary | ICD-10-CM

## 2013-11-05 MED ORDER — ZOLPIDEM TARTRATE 10 MG PO TABS: 10.0000 mg | ORAL_TABLET | Freq: Every evening | ORAL | Status: DC | PRN

## 2013-11-06 ENCOUNTER — Other Ambulatory Visit: Payer: Self-pay | Admitting: Radiology

## 2013-11-06 NOTE — Telephone Encounter (Signed)
Faxed Ambien 

## 2013-11-09 ENCOUNTER — Encounter: Payer: Self-pay | Admitting: Gastroenterology

## 2013-12-05 ENCOUNTER — Ambulatory Visit: Payer: 59 | Admitting: Neurology

## 2013-12-27 ENCOUNTER — Telehealth: Payer: Self-pay

## 2013-12-27 NOTE — Telephone Encounter (Signed)
Patient wants to know if Dr. Joseph Art called in shingles vaccine last year. He want to get one but is not sure if he another one called in or if the one called in before is still available.   [phone]

## 2013-12-28 NOTE — Telephone Encounter (Signed)
Patient needs rx reprinted. I have pended order.

## 2014-02-18 ENCOUNTER — Encounter: Payer: Self-pay | Admitting: Family Medicine

## 2014-02-18 ENCOUNTER — Ambulatory Visit (INDEPENDENT_AMBULATORY_CARE_PROVIDER_SITE_OTHER): Payer: 59 | Admitting: Family Medicine

## 2014-02-18 VITALS — BP 172/94 | HR 64 | Temp 98.2°F | Resp 16 | Ht 73.25 in | Wt 228.0 lb

## 2014-02-18 DIAGNOSIS — N528 Other male erectile dysfunction: Secondary | ICD-10-CM

## 2014-02-18 DIAGNOSIS — R0683 Snoring: Secondary | ICD-10-CM

## 2014-02-18 DIAGNOSIS — Z0000 Encounter for general adult medical examination without abnormal findings: Secondary | ICD-10-CM

## 2014-02-18 DIAGNOSIS — Z7189 Other specified counseling: Secondary | ICD-10-CM

## 2014-02-18 DIAGNOSIS — G4700 Insomnia, unspecified: Secondary | ICD-10-CM

## 2014-02-18 DIAGNOSIS — R0609 Other forms of dyspnea: Secondary | ICD-10-CM

## 2014-02-18 DIAGNOSIS — N529 Male erectile dysfunction, unspecified: Secondary | ICD-10-CM

## 2014-02-18 DIAGNOSIS — R0989 Other specified symptoms and signs involving the circulatory and respiratory systems: Secondary | ICD-10-CM

## 2014-02-18 DIAGNOSIS — Z7184 Encounter for health counseling related to travel: Secondary | ICD-10-CM

## 2014-02-18 LAB — COMPREHENSIVE METABOLIC PANEL
ALT: 25 U/L (ref 0–53)
AST: 24 U/L (ref 0–37)
Albumin: 4.4 g/dL (ref 3.5–5.2)
Alkaline Phosphatase: 47 U/L (ref 39–117)
BUN: 14 mg/dL (ref 6–23)
CO2: 25 mEq/L (ref 19–32)
Calcium: 8.9 mg/dL (ref 8.4–10.5)
Chloride: 103 mEq/L (ref 96–112)
Creat: 1.39 mg/dL — ABNORMAL HIGH (ref 0.50–1.35)
Glucose, Bld: 105 mg/dL — ABNORMAL HIGH (ref 70–99)
Potassium: 4.3 mEq/L (ref 3.5–5.3)
Sodium: 138 mEq/L (ref 135–145)
Total Bilirubin: 0.9 mg/dL (ref 0.2–1.2)
Total Protein: 6.8 g/dL (ref 6.0–8.3)

## 2014-02-18 LAB — POCT URINALYSIS DIPSTICK
Bilirubin, UA: NEGATIVE
Blood, UA: NEGATIVE
Glucose, UA: NEGATIVE
Ketones, UA: NEGATIVE
Leukocytes, UA: NEGATIVE
Nitrite, UA: NEGATIVE
Protein, UA: NEGATIVE
Spec Grav, UA: <=1.005
Urobilinogen, UA: 0.2
pH, UA: 5.5

## 2014-02-18 LAB — CBC WITH DIFFERENTIAL/PLATELET
Basophils Absolute: 0 10*3/uL (ref 0.0–0.1)
Basophils Relative: 0 % (ref 0–1)
Eosinophils Absolute: 0.2 10*3/uL (ref 0.0–0.7)
Eosinophils Relative: 2 % (ref 0–5)
HCT: 45.7 % (ref 39.0–52.0)
Hemoglobin: 15.9 g/dL (ref 13.0–17.0)
Lymphocytes Relative: 25 % (ref 12–46)
Lymphs Abs: 2 10*3/uL (ref 0.7–4.0)
MCH: 29.3 pg (ref 26.0–34.0)
MCHC: 34.8 g/dL (ref 30.0–36.0)
MCV: 84.3 fL (ref 78.0–100.0)
Monocytes Absolute: 0.7 10*3/uL (ref 0.1–1.0)
Monocytes Relative: 9 % (ref 3–12)
Neutro Abs: 5.1 10*3/uL (ref 1.7–7.7)
Neutrophils Relative %: 64 % (ref 43–77)
Platelets: 274 10*3/uL (ref 150–400)
RBC: 5.42 MIL/uL (ref 4.22–5.81)
RDW: 13.9 % (ref 11.5–15.5)
WBC: 7.9 10*3/uL (ref 4.0–10.5)

## 2014-02-18 LAB — LIPID PANEL
Cholesterol: 171 mg/dL (ref 0–200)
HDL: 51 mg/dL (ref >39)
LDL Cholesterol: 96 mg/dL (ref 0–99)
Total CHOL/HDL Ratio: 3.4 Ratio
Triglycerides: 121 mg/dL (ref <150)
VLDL: 24 mg/dL (ref 0–40)

## 2014-02-18 LAB — IFOBT (OCCULT BLOOD): IFOBT: NEGATIVE

## 2014-02-18 MED ORDER — TADALAFIL 20 MG PO TABS: 10.0000 mg | ORAL_TABLET | ORAL | Status: DC | PRN

## 2014-02-18 MED ORDER — ZOLPIDEM TARTRATE 10 MG PO TABS: 10.0000 mg | ORAL_TABLET | Freq: Every evening | ORAL | Status: DC | PRN

## 2014-02-18 MED ORDER — SCOPOLAMINE 1 MG/3DAYS TD PT72: 1.0000 | MEDICATED_PATCH | TRANSDERMAL | Status: DC

## 2014-02-18 NOTE — Progress Notes (Signed)
Patient ID: Jorge Barker MRN: [id number], DOB: 1956-05-14 58 y.o. Date of Encounter: 02/18/2014, 8:07 AM This chart was scribed for Robyn Haber, MD by Cathie Hoops, ED Scribe. The patient was seen in Room 25. The patient's care was started at 8:07 AM.   Primary Physician: Robyn Haber, MD  Chief Complaint: Physical (CPE)  HPI: 58 y.o. y/o male with history noted below here for CPE.  Doing well. No issues/complaints.  HPI Comments:  Married, retired Patent attorney. Jorge Barker is a 58 y.o. male who presents to the [facility] for CPE. 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 Pt reports he just drove into [place]. Pt reports he is going to [place], [place], and [place] for vacation. Pt reports his BP at home was 132/84. Pt reports when he got his DOT exam last week and his reading was 136/82. Pt reports his average BP reading is about 132/80. Pt reports he checks his BP every 2 weeks. Pt reports he still uses his CPAP machine and finds it to be helpful.  Review of Systems Consitutional: No fever, chills, fatigue, night sweats, lymphadenopathy, or weight changes. Eyes: No visual changes, eye redness, or discharge. ENT/Mouth: Ears: No otalgia, tinnitus, hearing loss, discharge. Nose: No congestion, rhinorrhea, sinus pain, or epistaxis. Throat: No sore throat, post nasal drip, or teeth pain. Cardiovascular: No CP, palpitations, diaphoresis, DOE, edema, orthopnea, PND. Respiratory: No cough, hemoptysis, SOB, or wheezing. Gastrointestinal: No anorexia, dysphagia, reflux, pain, nausea, vomiting, hematemesis, diarrhea, constipation, BRBPR, or melena. Genitourinary: No dysuria, frequency, urgency, hematuria, incontinence, nocturia, decreased urinary stream, discharge, impotence, or testicular pain/masses. Musculoskeletal: No decreased ROM, myalgias, stiffness, joint swelling, or weakness. Skin: No rash, erythema, lesion changes, pain, warmth,  jaundice, or pruritis. Neurological: No headache, dizziness, syncope, seizures, tremors, memory loss, coordination problems, or paresthesias. Psychological: No anxiety, depression, hallucinations, SI/HI. Endocrine: No fatigue, polydipsia, polyphagia, polyuria, or known diabetes. All other systems were reviewed and are otherwise negative.  Past Medical History  Diagnosis Date   Erectile dysfunction    Snoring    Sleep apnea with use of continuous positive airway pressure (CPAP) 06/04/2013     No past surgical history on file.  Home Meds:  Prior to Admission medications   Medication Sig Start Date End Date Taking? Authorizing Provider  tadalafil (CIALIS) 20 MG tablet Take 0.5-1 tablets (10-20 mg total) by mouth every other day as needed for erectile dysfunction. 02/18/14 03/20/14  Robyn Haber, MD  triamcinolone cream (KENALOG) 0.1 % Twice daily prn ear itching 03/08/13   Robyn Haber, MD  zolpidem (AMBIEN) 10 MG tablet Take 1 tablet (10 mg total) by mouth at bedtime as needed for sleep. 02/18/14 03/20/14  Robyn Haber, MD    Allergies:  Allergies  Allergen Reactions   Citalopram Hydrobromide     Felt hung-over   Influenza Vaccines     Serum sickness like illness   Oxycodone-Acetaminophen     REACTION: severe nausea and vomiting    History   Social History   Marital Status: Married    Spouse Name: N/A    Number of Children: 2   Years of Education: assoc.   Occupational History   drive bus part time    Social History Main Topics   Smoking status: Former Smoker -- 12 years    Types: Cigarettes    Quit date: 07/05/1981   Smokeless tobacco: Never Used   Alcohol Use: Yes     Comment: drinks beer and  spirit (RUM)/10oz weekly   Drug Use: No   Sexual Activity: Yes   Other Topics Concern   Not on file   Social History Narrative   Married. Education: [employer]. Exercise: Walk daily 30-60 minutes. Patient is right handed and resides in home with wife.   Consumes 1-2 cups of caffiene daily.    No family history on file.  Physical Exam: Blood pressure 172/94, pulse 64, temperature 98.2 F (36.8 C), temperature source Oral, resp. rate 16, height 6' 1.25" (1.861 m), weight 228 lb (103.42 kg), SpO2 98.00%.  BP Readings from Last 3 Encounters:  02/18/14 172/94  06/04/13 157/96  04/04/13 165/98   General: Well developed, well nourished, in no acute distress. HEENT: Normocephalic, atraumatic. Conjunctiva pink, sclera non-icteric. Pupils 2 mm constricting to 1 mm, round, regular, and equally reactive to light and accomodation. EOMI. Internal auditory canal clear. TMs with good cone of light and without pathology. Nasal mucosa pink. Nares are without discharge. No sinus tenderness. Oral mucosa pink. Dentition excellent. Pharynx without exudate.     Visual Acuity  Right Eye Distance:   Left Eye Distance:   Bilateral Distance:    Right Eye Near:   Left Eye Near:    Bilateral Near:      Neck: Supple. Trachea midline. No thyromegaly. Full ROM. No lymphadenopathy. Lungs: Clear to auscultation bilaterally without wheezes, rales, or rhonchi. Breathing is of normal effort and unlabored. Cardiovascular: RRR with S1 S2. No murmurs, rubs, or gallops appreciated. Distal pulses 2+ symmetrically. No carotid or abdominal bruits Abdomen: Soft, non-tender, non-distended with normoactive bowel sounds. No hepatosplenomegaly or masses. No rebound/guarding. No CVA tenderness. Without hernias.  Rectal: Small left  external hemorrhoid, no fissures. Rectal vault without masses.  Genitourinary: circumcised male. No penile lesions. Testes descended bilaterally, and smooth without tenderness or masses.  Musculoskeletal: Full range of motion and 5/5 strength throughout. Without swelling, atrophy, tenderness, crepitus, or warmth. Extremities without clubbing, cyanosis, or edema. Calves supple. Skin: Warm and moist without erythema, ecchymosis, wounds, or rash. Neuro:  A+Ox3. CN II-XII grossly intact. Moves all extremities spontaneously. Full sensation throughout. Normal gait. DTR 2+ throughout upper and lower extremities. Finger to nose intact. Psych:  Responds to questions appropriately with a normal affect.   Lab Results  Component Value Date   CHOL 157 03/08/2013   CHOL 176 10/28/2011   Lab Results  Component Value Date   HDL 45 03/08/2013   HDL 54 10/28/2011   Lab Results  Component Value Date   LDLCALC 83 03/08/2013   LDLCALC 107* 10/28/2011   Lab Results  Component Value Date   TRIG 144 03/08/2013   TRIG 76 10/28/2011   Lab Results  Component Value Date   CHOLHDL 3.5 03/08/2013   CHOLHDL 3.3 10/28/2011   No results found for this basename: LDLDIRECT    Assessment/Plan:  58 y.o. y/o  male here for CPE Annual physical exam - Plan: CBC with Differential, Comprehensive metabolic panel, PSA, Lipid panel, POCT urinalysis dipstick, IFOBT POC (occult bld, rslt in office), EKG 12-Lead, EKG 12-Lead  Other male erectile dysfunction - Plan: tadalafil (CIALIS) 20 MG tablet  Insomnia - Plan: zolpidem (AMBIEN) 10 MG tablet  Snoring - Plan: zolpidem (AMBIEN) 10 MG tablet  Travel advice encounter - Plan: scopolamine (TRANSDERM-SCOP) 1 MG/3DAYS   -  Signed, Robyn Haber, MD 02/18/2014 8:57 AM

## 2014-02-18 NOTE — Patient Instructions (Signed)
The blood pressure was elevated at today's visit. 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 I don't think you need to take medicine for this. Your labs are pending and should be back in 2-3 days at which time a Friday a cough. I called in the patches as well as the Ambien and Cialis. As we discussed, your colonoscopy will be scheduled by your gastroenterology specialist.   Health Maintenance A healthy lifestyle and preventative care can promote health and wellness.  Maintain regular health, dental, and eye exams.  Eat a healthy diet. Foods like vegetables, fruits, whole grains, low-fat dairy products, and lean protein foods contain the nutrients you need and are low in calories. Decrease your intake of foods high in solid fats, added sugars, and salt. Get information about a proper diet from your health care provider, if necessary.  Regular physical exercise is one of the most important things you can do for your health. Most adults should get at least 150 minutes of moderate-intensity exercise (any activity that increases your heart rate and causes you to sweat) each week. In addition, most adults need muscle-strengthening exercises on 2 or more days a week.   Maintain a healthy weight. The body mass index (BMI) is a screening tool to identify possible weight problems. It provides an estimate of body fat based on height and weight. Your health care provider can find your BMI and can help you achieve or maintain a healthy weight. For males 20 years and older:  A BMI below 18.5 is considered underweight.  A BMI of 18.5 to 24.9 is normal.  A BMI of 25 to 29.9 is considered overweight.  A BMI of 30 and above is considered obese.  Maintain normal blood lipids and cholesterol by exercising and minimizing your intake of saturated fat. Eat a  balanced diet with plenty of fruits and vegetables. Blood tests for lipids and cholesterol should begin at age 45 and be repeated every 5 years. If your lipid or cholesterol levels are high, you are over age 37, or you are at high risk for heart disease, you may need your cholesterol levels checked more frequently.Ongoing high lipid and cholesterol levels should be treated with medicines if diet and exercise are not working.  If you smoke, find out from your health care provider how to quit. If you do not use tobacco, do not start.  Lung cancer screening is recommended for adults aged 30-80 years who are at high risk for developing lung cancer because of a history of smoking. A yearly low-dose CT scan of the lungs is recommended for people who have at least a 30-pack-year history of smoking and are current smokers or have quit within the past 15 years. A pack year of smoking is smoking an average of 1 pack of cigarettes a day for 1 year (for example, a 30-pack-year history of smoking could mean smoking 1 pack a day for 30 years or 2 packs a day for 15 years). Yearly screening should continue until the smoker has stopped smoking for at least 15 years. Yearly screening should be stopped for people who develop a health problem that would prevent them from having lung cancer treatment.  If you choose to drink alcohol, do not have more than 2 drinks per day. One drink is considered to be 12 oz (360  mL) of beer, 5 oz (150 mL) of wine, or 1.5 oz (45 mL) of liquor.  Avoid the use of street drugs. Do not share needles with anyone. Ask for help if you need support or instructions about stopping the use of drugs.  High blood pressure causes heart disease and increases the risk of stroke. Blood pressure should be checked at least every 1-2 years. Ongoing high blood pressure should be treated with medicines if weight loss and exercise are not effective.  If you are 89-45 years old, ask your health care provider if  you should take aspirin to prevent heart disease.  Diabetes screening involves taking a blood sample to check your fasting blood sugar level. This should be done once every 3 years after age 23 if you are at a normal weight and without risk factors for diabetes. Testing should be considered at a younger age or be carried out more frequently if you are overweight and have at least 1 risk factor for diabetes.  Colorectal cancer can be detected and often prevented. Most routine colorectal cancer screening begins at the age of 75 and continues through age 24. However, your health care provider may recommend screening at an earlier age if you have risk factors for colon cancer. On a yearly basis, your health care provider may provide home test kits to check for hidden blood in the stool. A small camera at the end of a tube may be used to directly examine the colon (sigmoidoscopy or colonoscopy) to detect the earliest forms of colorectal cancer. Talk to your health care provider about this at age 79 when routine screening begins. A direct exam of the colon should be repeated every 5-10 years through age 61, unless early forms of precancerous polyps or small growths are found.  People who are at an increased risk for hepatitis B should be screened for this virus. You are considered at high risk for hepatitis B if:  You were born in a country where hepatitis B occurs often. Talk with your health care provider about which countries are considered high risk.  Your parents were born in a high-risk country and you have not received a shot to protect against hepatitis B (hepatitis B vaccine).  You have HIV or AIDS.  You use needles to inject street drugs.  You live with, or have sex with, someone who has hepatitis B.  You are a man who has sex with other men (MSM).  You get hemodialysis treatment.  You take certain medicines for conditions like cancer, organ transplantation, and autoimmune  conditions.  Hepatitis C blood testing is recommended for all people born from [birth year] through 1967 and any individual with known risk factors for hepatitis C.  Healthy men should no longer receive prostate-specific antigen (PSA) blood tests as part of routine cancer screening. Talk to your health care provider about prostate cancer screening.  Testicular cancer screening is not recommended for adolescents or adult males who have no symptoms. Screening includes self-exam, a health care provider exam, and other screening tests. Consult with your health care provider about any symptoms you have or any concerns you have about testicular cancer.  Practice safe sex. Use condoms and avoid high-risk sexual practices to reduce the spread of sexually transmitted infections (STIs).  You should be screened for STIs, including gonorrhea and chlamydia if:  You are sexually active and are younger than 24 years.  You are older than 24 years, and your health care provider tells you that  you are at risk for this type of infection.  Your sexual activity has changed since you were last screened, and you are at an increased risk for chlamydia or gonorrhea. Ask your health care provider if you are at risk.  If you are at risk of being infected with HIV, it is recommended that you take a prescription medicine daily to prevent HIV infection. This is called pre-exposure prophylaxis (PrEP). You are considered at risk if:  You are a man who has sex with other men (MSM).  You are a heterosexual man who is sexually active with multiple partners.  You take drugs by injection.  You are sexually active with a partner who has HIV.  Talk with your health care provider about whether you are at high risk of being infected with HIV. If you choose to begin PrEP, you should first be tested for HIV. You should then be tested every 3 months for as long as you are taking PrEP.  Use sunscreen. Apply sunscreen liberally and  repeatedly throughout the day. You should seek shade when your shadow is shorter than you. Protect yourself by wearing long sleeves, pants, a wide-brimmed hat, and sunglasses year round whenever you are outdoors.  Tell your health care provider of new moles or changes in moles, especially if there is a change in shape or color. Also, tell your health care provider if a mole is larger than the size of a pencil eraser.  A one-time screening for abdominal aortic aneurysm (AAA) and surgical repair of large AAAs by ultrasound is recommended for men aged 14-75 years who are current or former smokers.  Stay current with your vaccines (immunizations). Document Released: 12/18/2007 Document Revised: 06/26/2013 Document Reviewed: 11/16/2010 [facility] Patient Information 2015 [place], [place]. This information is not intended to replace advice given to you by your health care provider. Make sure you discuss any questions you have with your health care provider.

## 2014-02-18 NOTE — Progress Notes (Signed)
   Subjective:    Patient ID: Jorge Barker, male    DOB: [birth year], 58 y.o.   MRN: [id number]  HPI    Review of Systems  Constitutional: Negative.   HENT: Negative.   Eyes: Negative.   Respiratory: Negative.   Cardiovascular: Negative.   Gastrointestinal: Negative.   Endocrine: Negative.   Genitourinary: Negative.   Musculoskeletal: Negative.   Skin: Negative.   Allergic/Immunologic: Negative.   Neurological: Negative.   Hematological: Negative.   Psychiatric/Behavioral: Negative.        Objective:   Physical Exam        Assessment & Plan:

## 2014-02-19 ENCOUNTER — Other Ambulatory Visit: Payer: Self-pay | Admitting: Family Medicine

## 2014-02-19 ENCOUNTER — Encounter: Payer: Self-pay | Admitting: Family Medicine

## 2014-02-19 DIAGNOSIS — Z0000 Encounter for general adult medical examination without abnormal findings: Secondary | ICD-10-CM

## 2014-02-19 LAB — PSA: PSA: 0.86 ng/mL (ref <=4.00)

## 2014-02-20 MED ORDER — ZOSTER VACCINE LIVE 19400 UNT/0.65ML ~~LOC~~ SOLR: 0.6500 mL | Freq: Once | SUBCUTANEOUS | Status: DC

## 2014-02-20 MED ORDER — ZOSTER VACCINE LIVE 19400 UNT/0.65ML ~~LOC~~ SOLR
0.6500 mL | Freq: Once | SUBCUTANEOUS | Status: DC
Start: 2014-02-20 — End: 2015-04-09

## 2014-02-20 NOTE — Telephone Encounter (Signed)
See mychart messages

## 2014-02-26 ENCOUNTER — Ambulatory Visit (INDEPENDENT_AMBULATORY_CARE_PROVIDER_SITE_OTHER): Payer: 59 | Admitting: Neurology

## 2014-02-26 ENCOUNTER — Encounter: Payer: Self-pay | Admitting: Neurology

## 2014-02-26 VITALS — BP 151/78 | HR 71 | Resp 16 | Ht 74.25 in | Wt 226.0 lb

## 2014-02-26 DIAGNOSIS — G4700 Insomnia, unspecified: Secondary | ICD-10-CM

## 2014-02-26 DIAGNOSIS — R0989 Other specified symptoms and signs involving the circulatory and respiratory systems: Secondary | ICD-10-CM

## 2014-02-26 DIAGNOSIS — R0683 Snoring: Secondary | ICD-10-CM

## 2014-02-26 DIAGNOSIS — R0609 Other forms of dyspnea: Secondary | ICD-10-CM

## 2014-02-26 MED ORDER — ZOLPIDEM TARTRATE 10 MG PO TABS: 10.0000 mg | ORAL_TABLET | Freq: Every evening | ORAL | Status: DC | PRN

## 2014-02-26 NOTE — Progress Notes (Signed)
[facility]  Provider:  Larey Seat, M D  Referring Provider: Robyn Haber, MD Primary Care Physician:  Robyn Haber, MD  Chief Complaint  Patient presents with  . Follow-up    Room 11  . Sleep Apnea    HPI:  Jorge Barker is a 58 y.o. male , who is seen here as a referral from Dr. Joseph Art for evaluation of insomnia and snoring.   Compliance visit on 02-26-14,  User time is  7 hours and 25 minutes,  Compliance over 90 days is 94%. The residual AHI  is 0.3  and CPAP is set at 6 cm with 1 cm EPR. The patient uses a nasal mask- has a mustache. No ROS endorsed, Epworth see below.If not using CPAP, patient has headaches in the morning. Sinusitis is reduced since using CPAP.     LAST VISIT , CD:  Jorge Barker was originally referred directly for a sleep study by his primary care physician. The concerns addressed were that of nonrestorative sleep, insomnia and hypertension. It is also important that Jorge Barker's wife reported loud snoring and witnessed apneas. 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  He has started to use power naps  After his 77 birthday to get refreshed, he couldn't functio well without this once a day 30 minute nap.  He works out regularly, works part time as a Medical illustrator.  His past medical history was endorsed for hypertension, renal insufficiency, osteoarthritis, shiftworker history, insomnia with excessive daytime sleepiness and loud snoring. Witnessed apneas. The patient endorsed the Epworth sleepiness score of 16/24 points. His BMI was 28.9, his neck circumference 16.5 inches. Blood pressure measured prestudy was elevated at 165/98 mm Hg.   The  sleep study was performed on 04-04-13 the baseline study showed an AHI of 27.4 and an RDI of 28.4 during REM sleep the AHI was further elevated to 35.5 events per hour of sleep interestingly supine AHI was only 27. Lowest oxygen nadir was 82% was only 7.4 minutes of desaturations there were frequent periodic limb movements but almost normal related arousals. CPAP was initiated at 4 cm water and needed only to be increased to 6 cm water pressure unfortunately the PET patient's periodic limb movements accelerated under CPAP he was but his oxygen nadir now rose to  91%.   The patient reports that he has used CPAP at home but wakes regularly now at about 3. 30 - 4 in the morning. He is wide awake - feels ready to go .  Sometimes it very difficult for him to reinitiate sleep. 1 his wife reports that he sleeps better now and especially there is no breakthrough snoring or apnea witnessed anymore he is not sure is that he is tolerating CPAP that well. In the last month he has only taken 4 naps.  He has a runny nose when using humidity,  the mask is not bothering him and as a fireman he was used to we are respiratory gear. There is no claustrophobia. Nasal pillows were used as he has  facial hair.  The PLMs were decreased per wife report. Epworth score now 6 from previously 16 points. The patient bikes for exercise  regularly and noticed over the last month an increase in exercise tolerance from about 50 miles to 20 mild biking without being exhausted. CPAP data download show that the patient has used machine 10 days with an average time in therapy 7 hours and 22 minutes, residual AHI is 0.3. There is a moderate air leak. The setting of 6 cm water pressure with a 1 cm release.    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 Wakes up in AM spontaneously- not using alarm.  Rises at 6.00 AM,  7 hours of overall  sleep , used to have nocturia 3 times at night, none now.  No longer waking with a dry mouth , and as reported above he no longer needs the power naps.      Review of Systems: Out of a complete 14 system review, the patient complains of only the following symptoms, and all other reviewed systems are negative. Epworth from 16 to 7 points , CPAP at 6 cm. FSS 16  History   Social History  . Marital Status: Married    Spouse Name: [name]    Number of Children: 2  . Years of Education: Assoc.   Occupational History  . drive bus part time    Social History Main Topics  . Smoking status: Former Smoker -- 12 years    Types: Cigarettes    Quit date: 07/05/1981  . Smokeless tobacco: Never Used  . Alcohol Use: Yes     Comment: drinks beer and spirit (RUM)/10oz weekly  . Drug Use: No  . Sexual Activity: Yes   Other Topics Concern  . Not on file   Social History Narrative   Patient is married ([name]) and lives at home with his wife.   Patient has two adult children.   Married. Education: [employer]. Exercise: Walk daily 30-60 minutes. Patient is right handed and Consumes 1-2 cups of caffiene daily.    History reviewed. No pertinent family history.  Past Medical History  Diagnosis Date  . Erectile dysfunction   . Snoring   . Sleep apnea with use of continuous positive airway pressure (CPAP) 06/04/2013    History reviewed. No pertinent past surgical history.  Current Outpatient Prescriptions  Medication Sig Dispense Refill  . scopolamine (TRANSDERM-SCOP) 1 MG/3DAYS Place 1 patch (1.5 mg total) onto the skin every 3 (three) days.  10 patch  12  . tadalafil (CIALIS) 20 MG tablet Take 0.5-1 tablets (10-20 mg total) by mouth every other day as needed for erectile dysfunction.  10 tablet  11  . triamcinolone cream (KENALOG) 0.1 % Twice daily prn ear itching  30 g  0  . zolpidem (AMBIEN) 10 MG tablet Take 1 tablet (10 mg total) by mouth at bedtime as needed for sleep.  30 tablet  5  .  zoster vaccine live, PF, (ZOSTAVAX) 02542 UNT/0.65ML injection Inject 19,400 Units into the skin once.  1 each  0   No current facility-administered medications for this visit.    Allergies as of 02/26/2014 - Review Complete 02/26/2014  Allergen Reaction Noted  . Citalopram hydrobromide  10/28/2011  . Influenza vaccines  10/28/2011  . Oxycodone-acetaminophen  11/22/2008    Vitals: BP 151/78  Pulse 71  Resp 16  Ht 6' 2.25" (1.886 m)  Wt 226 lb (102.513 kg)  BMI 28.82 kg/m2 Last Weight:  Wt Readings from Last  1 Encounters:  02/26/14 226 lb (102.513 kg)   Last Height:   Ht Readings from Last 1 Encounters:  02/26/14 6' 2.25" (1.886 m)   Physical exam:  General: The patient is awake, alert and appears not in acute distress. The patient is well groomed. Head: Normocephalic, atraumatic.  Neck is supple. Mallampati 3 neck circumference: 16.50,  Cardiovascular:  Regular rate and rhythm , without  murmurs or carotid bruit, and without distended neck veins. Respiratory: Lungs are clear to auscultation. Skin:  Without evidence of edema, or rash. Trunk: BMI is normal.  Neurologic exam : The patient is awake and alert, oriented to place and time. Memory subjective described as intact.  There is a normal attention span & concentration ability.  Speech is fluent without  dysarthria, dysphonia or aphasia.  Mood and affect are appropriate.  Cranial nerves: Pupils are equal and briskly reactive to light. Funduscopic exam without evidence of pallor or edema.  Extraocular movements  in vertical and horizontal planes intact and without nystagmus. Visual fields by finger perimetry are intact. Hearing to finger rub intact.   Facial sensation intact to fine touch. Facial motor strength is symmetric and tongue and uvula move midline.  Motor exam:   Normal tone and normal muscle bulk and symmetric normal strength in all extremities.  Sensory:  Fine touch, pinprick and vibration,  proprioception is normal.  Coordination: Finger-to-nose maneuver tested and normal without evidence of ataxia, dysmetria or tremor.  Gait and station: Patient walks without assistive device . Deep tendon reflexes: in the  upper and lower extremities are symmetric and intact. Babinski  downgoing.   Assessment:  After physical and neurologic examination, review of laboratory studies, imaging, neurophysiology testing and pre-existing records, assessment is 1) OSA . Moderate severity - controlled on CPAP 6 cm water, nasal pillow.  2)Insomnia , nocturia and hypersomia all resolved.   Plan:  Treatment plan and additional workup : 1) continue CPAP use at 6 cm water, will follow in 12 month. Rv with NP .

## 2014-04-19 ENCOUNTER — Other Ambulatory Visit: Payer: Self-pay

## 2014-10-17 ENCOUNTER — Encounter: Payer: Self-pay | Admitting: Gastroenterology

## 2015-01-03 ENCOUNTER — Encounter: Payer: Self-pay | Admitting: Family Medicine

## 2015-01-03 DIAGNOSIS — N528 Other male erectile dysfunction: Secondary | ICD-10-CM

## 2015-01-03 DIAGNOSIS — R0683 Snoring: Secondary | ICD-10-CM

## 2015-01-03 DIAGNOSIS — G4700 Insomnia, unspecified: Secondary | ICD-10-CM

## 2015-01-09 MED ORDER — TADALAFIL 20 MG PO TABS: 10.0000 mg | ORAL_TABLET | ORAL | Status: DC | PRN

## 2015-01-09 MED ORDER — ZOLPIDEM TARTRATE 10 MG PO TABS: 10.0000 mg | ORAL_TABLET | Freq: Every evening | ORAL | Status: DC | PRN

## 2015-01-09 NOTE — Telephone Encounter (Signed)
1. Clinical staff:  I have renewed prescriptions for Cialis and Zolpidem.  Please CALL in refill for Zolpidem.  2.  Scheduling: please call to reschedule pt with a male provider for his physical.  He prefers a male and is currently scheduled with me in 03/2015.

## 2015-03-11 ENCOUNTER — Encounter: Payer: Self-pay | Admitting: Family Medicine

## 2015-03-15 ENCOUNTER — Telehealth: Payer: Self-pay | Admitting: Family Medicine

## 2015-03-15 NOTE — Telephone Encounter (Signed)
lmom that he has an appointment on 04/09/15 @ 2:00 with Philis Fendt for a CPE i think i had spoken with him already this week about his appointment

## 2015-03-17 ENCOUNTER — Encounter: Payer: Self-pay | Admitting: Physician Assistant

## 2015-03-17 ENCOUNTER — Encounter: Payer: Self-pay | Admitting: Family Medicine

## 2015-03-19 ENCOUNTER — Encounter: Payer: Self-pay | Admitting: Physician Assistant

## 2015-04-09 ENCOUNTER — Ambulatory Visit (INDEPENDENT_AMBULATORY_CARE_PROVIDER_SITE_OTHER): Payer: Commercial Managed Care - HMO | Admitting: Physician Assistant

## 2015-04-09 ENCOUNTER — Encounter: Payer: Self-pay | Admitting: Physician Assistant

## 2015-04-09 VITALS — BP 165/96 | HR 81 | Temp 97.9°F | Resp 16 | Ht 73.5 in | Wt 227.0 lb

## 2015-04-09 DIAGNOSIS — Z0000 Encounter for general adult medical examination without abnormal findings: Secondary | ICD-10-CM

## 2015-04-09 DIAGNOSIS — R030 Elevated blood-pressure reading, without diagnosis of hypertension: Secondary | ICD-10-CM

## 2015-04-09 DIAGNOSIS — Z760 Encounter for issue of repeat prescription: Secondary | ICD-10-CM

## 2015-04-09 DIAGNOSIS — Z139 Encounter for screening, unspecified: Secondary | ICD-10-CM | POA: Diagnosis not present

## 2015-04-09 DIAGNOSIS — Z299 Encounter for prophylactic measures, unspecified: Secondary | ICD-10-CM

## 2015-04-09 DIAGNOSIS — Z418 Encounter for other procedures for purposes other than remedying health state: Secondary | ICD-10-CM

## 2015-04-09 DIAGNOSIS — IMO0001 Reserved for inherently not codable concepts without codable children: Secondary | ICD-10-CM

## 2015-04-09 LAB — CBC
HCT: 45.9 % (ref 39.0–52.0)
HEMOGLOBIN: 15.6 g/dL (ref 13.0–17.0)
MCH: 29.3 pg (ref 26.0–34.0)
MCHC: 34 g/dL (ref 30.0–36.0)
MCV: 86.1 fL (ref 78.0–100.0)
MPV: 10.6 fL (ref 8.6–12.4)
PLATELETS: 291 10*3/uL (ref 150–400)
RBC: 5.33 MIL/uL (ref 4.22–5.81)
RDW: 13.6 % (ref 11.5–15.5)
WBC: 7.2 10*3/uL (ref 4.0–10.5)

## 2015-04-09 LAB — COMPREHENSIVE METABOLIC PANEL
ALBUMIN: 4.2 g/dL (ref 3.6–5.1)
ALT: 19 U/L (ref 9–46)
AST: 20 U/L (ref 10–35)
Alkaline Phosphatase: 48 U/L (ref 40–115)
BILIRUBIN TOTAL: 0.7 mg/dL (ref 0.2–1.2)
BUN: 15 mg/dL (ref 7–25)
CHLORIDE: 104 mmol/L (ref 98–110)
CO2: 26 mmol/L (ref 20–31)
CREATININE: 1.58 mg/dL — AB (ref 0.70–1.25)
Calcium: 9 mg/dL (ref 8.6–10.3)
GLUCOSE: 107 mg/dL — AB (ref 65–99)
Potassium: 4.4 mmol/L (ref 3.5–5.3)
SODIUM: 140 mmol/L (ref 135–146)
Total Protein: 6.2 g/dL (ref 6.1–8.1)

## 2015-04-09 LAB — POCT URINALYSIS DIP (MANUAL ENTRY)
BILIRUBIN UA: NEGATIVE
BILIRUBIN UA: NEGATIVE
Blood, UA: NEGATIVE
GLUCOSE UA: NEGATIVE
LEUKOCYTES UA: NEGATIVE
Nitrite, UA: NEGATIVE
Protein Ur, POC: NEGATIVE
Urobilinogen, UA: 0.2
pH, UA: 5.5

## 2015-04-09 LAB — TSH: TSH: 0.975 u[IU]/mL (ref 0.350–4.500)

## 2015-04-09 MED ORDER — TADALAFIL 20 MG PO TABS: 10.0000 mg | ORAL_TABLET | ORAL | Status: DC | PRN

## 2015-04-09 MED ORDER — ASPIRIN EC 81 MG PO TBEC: 81.0000 mg | DELAYED_RELEASE_TABLET | Freq: Every day | ORAL | Status: DC

## 2015-04-09 MED ORDER — ZOLPIDEM TARTRATE 10 MG PO TABS: 10.0000 mg | ORAL_TABLET | Freq: Every evening | ORAL | Status: DC | PRN

## 2015-04-09 NOTE — Patient Instructions (Signed)
If your BP is measuring consistently above 150/90 then contact the office at [phone] and leave a message for Philis Fendt PA-C.

## 2015-04-09 NOTE — Progress Notes (Signed)
04/10/2015 at 5:43 PM  Jorge Barker / DOB: August 12, 1955 / MRN: [id number]  The patient has HBP (high blood pressure); Renal insufficiency; Insomnia; Erectile dysfunction; Snoring; Sleep apnea with use of continuous positive airway pressure (CPAP); and Hypersomnia with sleep apnea, unspecified on his problem list.  SUBJECTIVE  Jorge Barker is a 59 y.o. well appearing male presenting for the chief complaint of need for annual physical.  Most recent colonoscopy was in 2010 and per his report he is due back.  He will call to make that appointment. Denies a personal history and family history of HTN.  He reports a history of white coat HTN.  Ambulatory measures run around 135-145/80-90.  He denies a history of DM2.  He does not take ASA daily.  He is an ex-smoker and quit 35 years ago. Risk and benefits of prostate screening discussed and patient opting not to test PSA.   Depression screen [facility] 2/9 04/09/2015 02/18/2014  Decreased Interest 0 0  Down, Depressed, Hopeless 0 0  PHQ - 2 Score 0 0    He  has a past medical history of Erectile dysfunction; Snoring; and Sleep apnea with use of continuous positive airway pressure (CPAP) (06/04/2013).    Medications reviewed and updated by myself where necessary, and exist elsewhere in the encounter.   [name] is allergic to citalopram hydrobromide; influenza vaccines; oxycodone-acetaminophen; and percocet. He  reports that he quit smoking about 33 years ago. His smoking use included Cigarettes. He quit after 12 years of use. He has never used smokeless tobacco. He reports that he drinks alcohol. He reports that he does not use illicit drugs. He  reports that he currently engages in sexual activity. The patient  has no past surgical history on file.  His family history is not on file.  Review of Systems  Constitutional: Negative for fever and chills.  Respiratory: Negative for shortness of breath.   Cardiovascular: Negative for chest pain.    Gastrointestinal: Negative for nausea and abdominal pain.  Genitourinary: Negative.   Skin: Negative for rash.  Neurological: Negative for dizziness and headaches.    OBJECTIVE  His  height is 6' 1.5" (1.867 m) and weight is 227 lb (102.967 kg). His temperature is 97.9 F (36.6 C). His blood pressure is 165/96 and his pulse is 81. His respiration is 16.  The patient's body mass index is 29.54 kg/(m^2).  Physical Exam  Vitals reviewed. Constitutional: He is oriented to person, place, and time. He appears well-developed. No distress.  Eyes: EOM are normal. Pupils are equal, round, and reactive to light. No scleral icterus.  Neck: Normal range of motion.  Cardiovascular: Normal rate and regular rhythm.   Respiratory: Effort normal and breath sounds normal.  GI: He exhibits no distension.  Musculoskeletal: Normal range of motion.  Neurological: He is alert and oriented to person, place, and time. No cranial nerve deficit.  Skin: Skin is warm and dry. No rash noted. He is not diaphoretic.  Psychiatric: He has a normal mood and affect.    No results found for this or any previous visit (from the past 24 hour(s)).  ASSESSMENT & PLAN  [name] was seen today for annual exam.  [name] was seen today for annual exam.  Diagnoses and all orders for this visit:  Annual physical exam  Screening -     CBC -     Comprehensive metabolic panel -     TSH -     POCT urinalysis  dipstick -     Hemoglobin A1c -     Hepatitis C antibody -     HIV antibody  Elevated BP: Patient with possible renal manifestations.  Will start low dose Amlodipine and recheck CMET in one month. He will continue to monitor his pressure three times weekly. If no improvement this will send to nephrology.    -     amLODipine (NORVASC) 2.5 MG tablet; Take 1 tablet (2.5 mg total) by mouth daily. -     COMPLETE METABOLIC PANEL WITH GFR; Future  Need for prophylactic measure -     aspirin EC 81 MG tablet; Take 1 tablet  (81 mg total) by mouth daily.  Medication refill -     tadalafil (CIALIS) 20 MG tablet; Take 0.5-1 tablets (10-20 mg total) by mouth every other day as needed for erectile dysfunction. -     zolpidem (AMBIEN) 10 MG tablet; Take 1 tablet (10 mg total) by mouth at bedtime as needed for sleep.      The patient was advised to call or come back to clinic if he does not see an improvement in symptoms, or worsens with the above plan.   Philis Fendt, MHS, PA-C [facility] 04/10/2015 5:43 PM

## 2015-04-10 LAB — HIV ANTIBODY (ROUTINE TESTING W REFLEX): HIV: NONREACTIVE

## 2015-04-10 LAB — HEPATITIS C ANTIBODY: HCV AB: NEGATIVE

## 2015-04-10 LAB — HEMOGLOBIN A1C
Hgb A1c MFr Bld: 5.7 % — ABNORMAL HIGH (ref <5.7)
Mean Plasma Glucose: 117 mg/dL — ABNORMAL HIGH (ref <117)

## 2015-04-10 MED ORDER — AMLODIPINE BESYLATE 2.5 MG PO TABS: 2.5000 mg | ORAL_TABLET | Freq: Every day | ORAL | Status: DC

## 2015-04-15 ENCOUNTER — Telehealth: Payer: Self-pay

## 2015-04-15 NOTE — Telephone Encounter (Signed)
-----   Message from Tereasa Coop, PA-C sent at 04/10/2015  4:30 PM EDT ----- Please call [facility] and add a GFR. Philis Fendt, MS, PA-C   4:30 PM, 04/10/2015

## 2015-04-15 NOTE — Telephone Encounter (Signed)
Spoke Geographical information systems officer at [facility]. Test added

## 2015-05-12 ENCOUNTER — Other Ambulatory Visit (INDEPENDENT_AMBULATORY_CARE_PROVIDER_SITE_OTHER): Payer: Commercial Managed Care - HMO

## 2015-05-12 DIAGNOSIS — IMO0001 Reserved for inherently not codable concepts without codable children: Secondary | ICD-10-CM

## 2015-05-12 DIAGNOSIS — R030 Elevated blood-pressure reading, without diagnosis of hypertension: Secondary | ICD-10-CM

## 2015-05-12 LAB — COMPLETE METABOLIC PANEL WITH GFR
ALT: 19 U/L (ref 9–46)
AST: 20 U/L (ref 10–35)
Albumin: 3.7 g/dL (ref 3.6–5.1)
Alkaline Phosphatase: 55 U/L (ref 40–115)
BUN: 15 mg/dL (ref 7–25)
CHLORIDE: 105 mmol/L (ref 98–110)
CO2: 29 mmol/L (ref 20–31)
CREATININE: 1.42 mg/dL — AB (ref 0.70–1.25)
Calcium: 9 mg/dL (ref 8.6–10.3)
GFR, Est African American: 61 mL/min (ref >=60)
GFR, Est Non African American: 53 mL/min — ABNORMAL LOW (ref >=60)
GLUCOSE: 113 mg/dL — AB (ref 65–99)
POTASSIUM: 5.1 mmol/L (ref 3.5–5.3)
SODIUM: 141 mmol/L (ref 135–146)
Total Bilirubin: 0.4 mg/dL (ref 0.2–1.2)
Total Protein: 6.3 g/dL (ref 6.1–8.1)

## 2015-05-12 NOTE — Progress Notes (Signed)
Pt is here for lab work only. 

## 2015-05-13 ENCOUNTER — Encounter: Payer: Self-pay | Admitting: Physician Assistant

## 2015-05-14 ENCOUNTER — Other Ambulatory Visit: Payer: Self-pay | Admitting: Physician Assistant

## 2015-05-14 DIAGNOSIS — I10 Essential (primary) hypertension: Secondary | ICD-10-CM

## 2015-05-14 DIAGNOSIS — R7989 Other specified abnormal findings of blood chemistry: Secondary | ICD-10-CM

## 2015-06-12 ENCOUNTER — Encounter: Payer: Self-pay | Admitting: Physician Assistant

## 2015-06-13 ENCOUNTER — Telehealth: Payer: Self-pay

## 2015-06-13 NOTE — Telephone Encounter (Signed)
Refer to e-mail from 12/8. This is concerning his amLODipine (NORVASC) 2.5 MG tablet [id number] script. Pharmacy is good. CB # [phone]

## 2015-06-14 ENCOUNTER — Other Ambulatory Visit: Payer: Self-pay | Admitting: Physician Assistant

## 2015-06-14 DIAGNOSIS — IMO0001 Reserved for inherently not codable concepts without codable children: Secondary | ICD-10-CM

## 2015-06-14 DIAGNOSIS — R030 Elevated blood-pressure reading, without diagnosis of hypertension: Principal | ICD-10-CM

## 2015-06-14 MED ORDER — AMLODIPINE BESYLATE 2.5 MG PO TABS: 5.0000 mg | ORAL_TABLET | Freq: Every day | ORAL | Status: DC

## 2015-06-15 NOTE — Telephone Encounter (Signed)
See my chart message/ he states med is Amlodipine 2.5 mg

## 2015-06-16 ENCOUNTER — Other Ambulatory Visit: Payer: Self-pay | Admitting: Physician Assistant

## 2015-06-16 MED ORDER — AMLODIPINE BESYLATE 2.5 MG PO TABS: 2.5000 mg | ORAL_TABLET | Freq: Every day | ORAL | Status: DC

## 2015-06-16 MED ORDER — AMLODIPINE BESYLATE 5 MG PO TABS: 5.0000 mg | ORAL_TABLET | Freq: Every day | ORAL | Status: DC

## 2015-06-16 NOTE — Telephone Encounter (Signed)
I have ordered the 5 mg po qd #90 2 refills.  Philis Fendt, MS, PA-C 1:14 PM, 06/16/2015

## 2015-06-16 NOTE — Telephone Encounter (Signed)
Pharm called bc sig for Norvasc was 2 tabs QD but only given #90. After reading this message. Gave OK to change to #180.

## 2015-06-18 ENCOUNTER — Other Ambulatory Visit (INDEPENDENT_AMBULATORY_CARE_PROVIDER_SITE_OTHER): Payer: Commercial Managed Care - HMO

## 2015-06-18 ENCOUNTER — Other Ambulatory Visit: Payer: Self-pay | Admitting: Physician Assistant

## 2015-06-18 DIAGNOSIS — I10 Essential (primary) hypertension: Secondary | ICD-10-CM | POA: Diagnosis not present

## 2015-06-18 DIAGNOSIS — R7989 Other specified abnormal findings of blood chemistry: Secondary | ICD-10-CM

## 2015-06-18 LAB — COMPLETE METABOLIC PANEL WITH GFR
ALT: 23 U/L (ref 9–46)
AST: 20 U/L (ref 10–35)
Albumin: 4.2 g/dL (ref 3.6–5.1)
Alkaline Phosphatase: 56 U/L (ref 40–115)
BUN: 13 mg/dL (ref 7–25)
CHLORIDE: 104 mmol/L (ref 98–110)
CO2: 28 mmol/L (ref 20–31)
CREATININE: 1.48 mg/dL — AB (ref 0.70–1.25)
Calcium: 9.2 mg/dL (ref 8.6–10.3)
GFR, EST AFRICAN AMERICAN: 58 mL/min — AB (ref >=60)
GFR, Est Non African American: 50 mL/min — ABNORMAL LOW (ref >=60)
Glucose, Bld: 116 mg/dL — ABNORMAL HIGH (ref 65–99)
Potassium: 4.2 mmol/L (ref 3.5–5.3)
Sodium: 140 mmol/L (ref 135–146)
Total Bilirubin: 0.7 mg/dL (ref 0.2–1.2)
Total Protein: 6.7 g/dL (ref 6.1–8.1)

## 2015-07-31 ENCOUNTER — Encounter: Payer: Self-pay | Admitting: Physician Assistant

## 2015-08-01 ENCOUNTER — Other Ambulatory Visit: Payer: Self-pay | Admitting: Physician Assistant

## 2015-08-01 NOTE — Progress Notes (Unsigned)
Immunization History  Administered Date(s) Administered  . Tdap 10/28/2011

## 2015-09-25 ENCOUNTER — Other Ambulatory Visit: Payer: Self-pay | Admitting: Nephrology

## 2015-09-25 DIAGNOSIS — N183 Chronic kidney disease, stage 3 unspecified: Secondary | ICD-10-CM

## 2015-09-29 ENCOUNTER — Ambulatory Visit
Admission: RE | Admit: 2015-09-29 | Discharge: 2015-09-29 | Disposition: A | Discharge status: Home / Self Care | Payer: Commercial Managed Care - HMO | Source: Ambulatory Visit | Attending: Nephrology | Admitting: Nephrology

## 2015-09-29 DIAGNOSIS — N183 Chronic kidney disease, stage 3 unspecified: Secondary | ICD-10-CM

## 2015-11-18 ENCOUNTER — Encounter: Payer: Self-pay | Admitting: Physician Assistant

## 2015-11-19 ENCOUNTER — Other Ambulatory Visit: Payer: Self-pay | Admitting: Physician Assistant

## 2015-11-19 DIAGNOSIS — Z760 Encounter for issue of repeat prescription: Secondary | ICD-10-CM

## 2015-11-19 MED ORDER — TADALAFIL 20 MG PO TABS: 10.0000 mg | ORAL_TABLET | ORAL | Status: DC | PRN

## 2015-12-14 ENCOUNTER — Other Ambulatory Visit: Payer: Self-pay | Admitting: Physician Assistant

## 2016-06-02 ENCOUNTER — Encounter: Payer: Self-pay | Admitting: Family Medicine

## 2016-06-02 ENCOUNTER — Ambulatory Visit (INDEPENDENT_AMBULATORY_CARE_PROVIDER_SITE_OTHER): Payer: Commercial Managed Care - HMO | Admitting: Family Medicine

## 2016-06-02 VITALS — BP 130/80 | HR 68 | Temp 98.0°F | Resp 16 | Ht 73.5 in | Wt 228.8 lb

## 2016-06-02 DIAGNOSIS — N5203 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 dysfunction: Secondary | ICD-10-CM

## 2016-06-02 DIAGNOSIS — G4730 Sleep apnea, unspecified: Secondary | ICD-10-CM

## 2016-06-02 DIAGNOSIS — Z0000 Encounter for general adult medical examination without abnormal findings: Secondary | ICD-10-CM

## 2016-06-02 DIAGNOSIS — Z131 Encounter for screening for diabetes mellitus: Secondary | ICD-10-CM | POA: Diagnosis not present

## 2016-06-02 DIAGNOSIS — Z6829 Body mass index (BMI) 29.0-29.9, adult: Secondary | ICD-10-CM | POA: Diagnosis not present

## 2016-06-02 DIAGNOSIS — I10 Essential (primary) hypertension: Secondary | ICD-10-CM

## 2016-06-02 DIAGNOSIS — N289 Disorder of kidney and ureter, unspecified: Secondary | ICD-10-CM

## 2016-06-02 DIAGNOSIS — Z760 Encounter for issue of repeat prescription: Secondary | ICD-10-CM

## 2016-06-02 DIAGNOSIS — F5102 Adjustment insomnia: Secondary | ICD-10-CM | POA: Diagnosis not present

## 2016-06-02 LAB — COMPREHENSIVE METABOLIC PANEL
ALK PHOS: 68 U/L (ref 40–115)
ALT: 22 U/L (ref 9–46)
AST: 25 U/L (ref 10–35)
Albumin: 4.1 g/dL (ref 3.6–5.1)
BUN: 14 mg/dL (ref 7–25)
CHLORIDE: 103 mmol/L (ref 98–110)
CO2: 28 mmol/L (ref 20–31)
Calcium: 9.2 mg/dL (ref 8.6–10.3)
Creat: 1.38 mg/dL — ABNORMAL HIGH (ref 0.70–1.25)
GLUCOSE: 106 mg/dL — AB (ref 65–99)
POTASSIUM: 4.6 mmol/L (ref 3.5–5.3)
Sodium: 139 mmol/L (ref 135–146)
Total Bilirubin: 0.6 mg/dL (ref 0.2–1.2)
Total Protein: 6.5 g/dL (ref 6.1–8.1)

## 2016-06-02 LAB — POCT URINALYSIS DIP (MANUAL ENTRY)
BILIRUBIN UA: NEGATIVE
Bilirubin, UA: NEGATIVE
Blood, UA: NEGATIVE
Glucose, UA: NEGATIVE
LEUKOCYTES UA: NEGATIVE
Nitrite, UA: NEGATIVE
PH UA: 5
PROTEIN UA: NEGATIVE
Spec Grav, UA: 1.015
Urobilinogen, UA: 0.2

## 2016-06-02 LAB — CBC WITH DIFFERENTIAL/PLATELET
BASOS PCT: 0 %
Basophils Absolute: 0 cells/uL (ref 0–200)
EOS PCT: 2 %
Eosinophils Absolute: 152 cells/uL (ref 15–500)
HCT: 47 % (ref 38.5–50.0)
Hemoglobin: 16 g/dL (ref 13.2–17.1)
Lymphocytes Relative: 29 %
Lymphs Abs: 2204 cells/uL (ref 850–3900)
MCH: 30.1 pg (ref 27.0–33.0)
MCHC: 34 g/dL (ref 32.0–36.0)
MCV: 88.3 fL (ref 80.0–100.0)
MONOS PCT: 9 %
MPV: 9.8 fL (ref 7.5–12.5)
Monocytes Absolute: 684 cells/uL (ref 200–950)
NEUTROS ABS: 4560 {cells}/uL (ref 1500–7800)
Neutrophils Relative %: 60 %
PLATELETS: 324 10*3/uL (ref 140–400)
RBC: 5.32 MIL/uL (ref 4.20–5.80)
RDW: 13.1 % (ref 11.0–15.0)
WBC: 7.6 10*3/uL (ref 3.8–10.8)

## 2016-06-02 LAB — TSH: TSH: 1.04 mIU/L (ref 0.40–4.50)

## 2016-06-02 LAB — LIPID PANEL
CHOL/HDL RATIO: 3.6 ratio (ref <5.0)
Cholesterol: 152 mg/dL (ref <200)
HDL: 42 mg/dL (ref >40)
LDL CALC: 88 mg/dL (ref <100)
Triglycerides: 111 mg/dL (ref <150)
VLDL: 22 mg/dL (ref <30)

## 2016-06-02 LAB — HEMOGLOBIN A1C
HEMOGLOBIN A1C: 5.5 % (ref <5.7)
Mean Plasma Glucose: 111 mg/dL

## 2016-06-02 LAB — PSA: PSA: 1 ng/mL (ref <=4.0)

## 2016-06-02 MED ORDER — TADALAFIL 20 MG PO TABS: 10.0000 mg | ORAL_TABLET | ORAL | 11 refills | Status: DC | PRN

## 2016-06-02 MED ORDER — ZOLPIDEM TARTRATE 10 MG PO TABS: 10.0000 mg | ORAL_TABLET | Freq: Every evening | ORAL | 3 refills | Status: DC | PRN

## 2016-06-02 NOTE — Progress Notes (Signed)
Subjective:    Patient ID: Jorge Barker, male    DOB: 25-May-1956, 60 y.o.   MRN: [id number]  06/02/2016  Annual Exam   HPI This 60 y.o. male presents for Complete Physical Examination.  Previous patient of Dr. Joseph Art; Dr. Joni Fears retired.  Worked for the city.  Last physical: 04-09-2015 Colonoscopy: 2010; repeat in 10 years. Eye exam: +glsses; yearly in12/2017 Dental exam:  Every six months.  Immunization History  Administered Date(s) Administered  . Tdap 10/28/2011  . Zoster 07/05/2013   BP Readings from Last 3 Encounters:  06/02/16 130/80  04/09/15 (!) 165/96  02/26/14 (!) 151/78   Wt Readings from Last 3 Encounters:  06/02/16 228 lb 12.8 oz (103.8 kg)  04/09/15 227 lb (103 kg)  02/26/14 226 lb (102.5 kg)   Renal insufficiency/HTN: Only taking Irbesartan now; not taking Amlodipine.  Creatinine increased last year; referred to [facility].    Granddaughter with terminal illness; age 51; brain is deteriorating. Two mutated chromosomes.  Diagnosed one year ago.     Review of Systems  Constitutional: Negative for activity change, appetite change, chills, diaphoresis, fatigue, fever and unexpected weight change.  HENT: Negative for congestion, dental problem, drooling, ear discharge, ear pain, facial swelling, hearing loss, mouth sores, nosebleeds, postnasal drip, rhinorrhea, sinus pressure, sneezing, sore throat, tinnitus, trouble swallowing and voice change.   Eyes: Negative for photophobia, pain, discharge, redness, itching and visual disturbance.  Respiratory: Negative for apnea, cough, choking, chest tightness, shortness of breath, wheezing and stridor.   Cardiovascular: Negative for chest pain, palpitations and leg swelling.  Gastrointestinal: Negative for abdominal pain, blood in stool, constipation, diarrhea, nausea and vomiting.  Endocrine: Negative for cold intolerance, heat intolerance, polydipsia, polyphagia and polyuria.  Genitourinary: Negative for  decreased urine volume, difficulty urinating, discharge, dysuria, enuresis, flank pain, frequency, genital sores, hematuria, penile pain, penile swelling, scrotal swelling, testicular pain and urgency.       Nocturia x 0.  Urinary stream is strong.  Sex drive is good.  ED present.  Musculoskeletal: Negative for arthralgias, back pain, gait problem, joint swelling, myalgias, neck pain and neck stiffness.  Skin: Negative for color change, pallor, rash and wound.  Allergic/Immunologic: Negative for environmental allergies, food allergies and immunocompromised state.  Neurological: Negative for dizziness, tremors, seizures, syncope, facial asymmetry, speech difficulty, weakness, light-headedness, numbness and headaches.  Hematological: Negative for adenopathy. Does not bruise/bleed easily.  Psychiatric/Behavioral: Positive for sleep disturbance. Negative for agitation, behavioral problems, confusion, decreased concentration, dysphoric mood, hallucinations, self-injury and suicidal ideas. The patient is not nervous/anxious and is not hyperactive.        Bedtime 10:00pm; wakes up at 5:00-7:00am.  Uses Ambien with traveling.     Past Medical History:  Diagnosis Date  . Chronic renal insufficiency, stage 2 (mild)    [name] every six months.  . Erectile dysfunction   . Hypertension   . Sleep apnea with use of continuous positive airway pressure (CPAP) 06/04/2013  . Snoring    History reviewed. No pertinent surgical history. Allergies  Allergen Reactions  . Citalopram Hydrobromide     Felt hung-over  . Influenza Vaccines     Serum sickness like illness  . Oxycodone-Acetaminophen     REACTION: severe nausea and vomiting  . Percocet [Oxycodone-Acetaminophen]    Current Outpatient Prescriptions  Medication Sig Dispense Refill  . irbesartan (AVAPRO) 150 MG tablet Take 150 mg by mouth daily.    Marland Kitchen amLODipine (NORVASC) 5 MG tablet TAKE 1 TABLET (5 MG TOTAL) BY  MOUTH DAILY. (Patient not taking:  Reported on 06/02/2016) 90 tablet 0  . aspirin EC 81 MG tablet Take 1 tablet (81 mg total) by mouth daily. (Patient not taking: Reported on 06/02/2016) 30 tablet 11  . azelastine (ASTELIN) 0.1 % nasal spray Place 2 sprays into both nostrils 2 (two) times daily. Use in each nostril as directed 30 mL 12  . tadalafil (CIALIS) 20 MG tablet Take 0.5-1 tablets (10-20 mg total) by mouth every other day as needed for erectile dysfunction. 10 tablet 11  . zolpidem (AMBIEN) 10 MG tablet Take 1 tablet (10 mg total) by mouth at bedtime as needed for sleep. 30 tablet 3   No current facility-administered medications for this visit.    Social History   Social History  . Marital status: Married    Spouse name: [name]  . Number of children: 2  . Years of education: Assoc.   Occupational History  . drive bus part time Retired   Social History Main Topics  . Smoking status: Former Smoker    Years: 12.00    Types: Cigarettes    Quit date: 07/05/1981  . Smokeless tobacco: Never Used  . Alcohol use Yes     Comment: drinks beer and spirit (RUM)/10oz weekly  . Drug use: No  . Sexual activity: Yes    Birth control/ protection: Post-menopausal   Other Topics Concern  . Not on file   Social History Narrative   Patient is married ([name]) and lives at home with his wife.  Married x 1977.   Patient has two adult children.  Four grandchildren      Lives: with mom.   Married. Education: [employer]. Exercise: Walk daily 30-60 minutes. Patient is right handed and Consumes 1-2 cups of caffiene daily.      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       Tobacco; quit 35 years ago; smoked for 95-28 yo.      Alcohol: 3-5 drinks per week       Exercise:  None in 2017              Family History  Problem Relation Age of Onset  . Cancer Mother     intestinal cancer/abdomen         Objective:    BP 130/80 (BP Location: Left Arm, Patient Position: Sitting, Cuff Size: Small)   Pulse 68   Temp 98 F (36.7 C) (Oral)   Resp 16   Ht 6' 1.5" (1.867 m)   Wt 228 lb 12.8 oz (103.8 kg)   SpO2 98%   BMI 29.78 kg/m  Physical Exam  Constitutional: He is oriented to person, place, and time. He appears well-developed and well-nourished. No distress.  HENT:  Head: Normocephalic and atraumatic.  Right Ear: External ear normal.  Left Ear: External ear normal.  Nose: Nose normal.  Mouth/Throat: Oropharynx is clear and moist.  Eyes: Conjunctivae and EOM are normal. Pupils are equal, round, and reactive to light.  Neck: Normal range of motion. Neck supple. Carotid bruit is not present. No thyromegaly present.  Cardiovascular: Normal rate, regular rhythm, normal heart sounds and intact distal pulses.  Exam reveals no gallop and no friction rub.   No murmur heard. Pulmonary/Chest: Effort normal and breath sounds normal. He has no wheezes. He has no rales.  Abdominal: Soft. Bowel sounds  are normal. He exhibits no distension and no mass. There is no tenderness. There is no rebound and no guarding. Hernia confirmed negative in the right inguinal area and confirmed negative in the left inguinal area.  Genitourinary: Penis normal.  Genitourinary Comments: Pt declined rectal/prostate exam.  Musculoskeletal:       Right shoulder: Normal.       Left shoulder: Normal.       Cervical back: Normal.  Lymphadenopathy:    He has no cervical adenopathy.       Right: No inguinal adenopathy present.       Left: No inguinal adenopathy present.  Neurological: He is alert and oriented to person, place, and time. He has normal reflexes. No cranial nerve deficit. He exhibits normal muscle tone. Coordination normal.  Skin: Skin is warm and dry. No rash noted. He is not diaphoretic.  Psychiatric: He has a normal mood and affect. His behavior is normal. Judgment and thought content normal.   Results  for orders placed or performed in visit on 06/02/16  CBC with Differential/Platelet  Result Value Ref Range   WBC 7.6 3.8 - 10.8 K/uL   RBC 5.32 4.20 - 5.80 MIL/uL   Hemoglobin 16.0 13.2 - 17.1 g/dL   HCT 47.0 38.5 - 50.0 %   MCV 88.3 80.0 - 100.0 fL   MCH 30.1 27.0 - 33.0 pg   MCHC 34.0 32.0 - 36.0 g/dL   RDW 13.1 11.0 - 15.0 %   Platelets 324 140 - 400 K/uL   MPV 9.8 7.5 - 12.5 fL   Neutro Abs 4,560 1,500 - 7,800 cells/uL   Lymphs Abs 2,204 850 - 3,900 cells/uL   Monocytes Absolute 684 200 - 950 cells/uL   Eosinophils Absolute 152 15 - 500 cells/uL   Basophils Absolute 0 0 - 200 cells/uL   Neutrophils Relative % 60 %   Lymphocytes Relative 29 %   Monocytes Relative 9 %   Eosinophils Relative 2 %   Basophils Relative 0 %   Smear Review Criteria for review not met   Comprehensive metabolic panel  Result Value Ref Range   Sodium 139 135 - 146 mmol/L   Potassium 4.6 3.5 - 5.3 mmol/L   Chloride 103 98 - 110 mmol/L   CO2 28 20 - 31 mmol/L   Glucose, Bld 106 (H) 65 - 99 mg/dL   BUN 14 7 - 25 mg/dL   Creat 1.38 (H) 0.70 - 1.25 mg/dL   Total Bilirubin 0.6 0.2 - 1.2 mg/dL   Alkaline Phosphatase 68 40 - 115 U/L   AST 25 10 - 35 U/L   ALT 22 9 - 46 U/L   Total Protein 6.5 6.1 - 8.1 g/dL   Albumin 4.1 3.6 - 5.1 g/dL   Calcium 9.2 8.6 - 10.3 mg/dL  Lipid panel  Result Value Ref Range   Cholesterol 152 <200 mg/dL   Triglycerides 111 <150 mg/dL   HDL 42 >40 mg/dL   Total CHOL/HDL Ratio 3.6 <5.0 Ratio   VLDL 22 <30 mg/dL   LDL Cholesterol 88 <100 mg/dL  PSA  Result Value Ref Range   PSA 1.0 <=4.0 ng/mL  Hemoglobin A1c  Result Value Ref Range   Hgb A1c MFr Bld 5.5 <5.7 %   Mean Plasma Glucose 111 mg/dL  TSH  Result Value Ref Range   TSH 1.04 0.40 - 4.50 mIU/L  POCT urinalysis dipstick  Result Value Ref Range   Color, UA yellow yellow   Clarity, UA  clear clear   Glucose, UA negative negative   Bilirubin, UA negative negative   Ketones, POC UA negative negative   Spec  Grav, UA 1.015    Blood, UA negative negative   pH, UA 5.0    Protein Ur, POC negative negative   Urobilinogen, UA 0.2    Nitrite, UA Negative Negative   Leukocytes, UA Negative Negative   Depression screen [facility] 2/9 06/02/2016 04/09/2015 02/18/2014  Decreased Interest 0 0 0  Down, Depressed, Hopeless 0 0 0  PHQ - 2 Score 0 0 0   Fall Risk  06/02/2016 04/09/2015  Falls in the past year? No No       Assessment & Plan:   1. Routine physical examination   2. Essential hypertension   3. Renal insufficiency   4. Sleep apnea with use of continuous positive airway pressure (CPAP)   5. 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 dysfunction   6. Medication refill   7. Screening for diabetes mellitus    -anticipatory guidance --- exercise, weight loss. -obtain age appropriate screening labs. -obtain labs for chronic disease states. -followed by nephrology closely for renal insufficiency -has granddaughter with terminal illness; coping relatively well at this time. -refills provided.   Orders Placed This Encounter  Procedures  . CBC with Differential/Platelet  . Comprehensive metabolic panel    Order Specific Question:   Has the patient fasted?    Answer:   Yes  . Lipid panel    Order Specific Question:   Has the patient fasted?    Answer:   Yes  . PSA  . Hemoglobin A1c  . TSH  . POCT urinalysis dipstick  . EKG 12-Lead   Meds ordered this encounter  Medications  . irbesartan (AVAPRO) 150 MG tablet    Sig: Take 150 mg by mouth daily.  Marland Kitchen DISCONTD: tadalafil (CIALIS) 20 MG tablet    Sig: Take 0.5-1 tablets (10-20 mg total) by mouth every other day as needed for erectile dysfunction.    Dispense:  10 tablet    Refill:  11  . zolpidem (AMBIEN) 10 MG tablet    Sig: Take 1 tablet (10 mg total) by mouth at bedtime as needed for sleep.    Dispense:  30 tablet    Refill:  3  . tadalafil (CIALIS) 20 MG tablet    Sig: Take 0.5-1 tablets (10-20 mg total)  by mouth every other day as needed for erectile dysfunction.    Dispense:  10 tablet    Refill:  11    No Follow-up on file.   Montray Kliebert Elayne Guerin, M.D. [facility] [address] [place], [place]  [phone] phone [phone] fax

## 2016-06-02 NOTE — Patient Instructions (Addendum)
   IF you received an x-ray today, you will receive an invoice from [facility]. Please contact [facility] at [phone] with questions or concerns regarding your invoice.   IF you received labwork today, you will receive an invoice from [facility]. Please contact [facility] at [phone] with questions or concerns regarding your invoice.   Our billing staff will not be able to assist you with questions regarding bills from these companies.  You will be contacted with the lab results as soon as they are available. The fastest way to get your results is to activate your My Chart account. Instructions are located on the last page of this paperwork. If you have not heard from us regarding the results in 2 weeks, please contact this office.    Keeping you healthy  Get these tests  Blood pressure- Have your blood pressure checked once a year by your healthcare provider.  Normal blood pressure is 120/80  Weight- Have your body mass index (BMI) calculated to screen for obesity.  BMI is a measure of body fat based on height and weight. You can also calculate your own BMI at [email or web address].[email or web address]/bmi/.  Cholesterol- Have your cholesterol checked every year.  Diabetes- Have your blood sugar checked regularly if you have high blood pressure, high cholesterol, have a family history of diabetes or if you are overweight.  Screening for Colon Cancer- Colonoscopy starting at age 50.  Screening may begin sooner depending on your family history and other health conditions. Follow up colonoscopy as directed by your Gastroenterologist.  Screening for Prostate Cancer- Both blood work (PSA) and a rectal exam help screen for Prostate Cancer.  Screening begins at age 40 with African-American men and at age 50 with Caucasian men.  Screening may begin sooner depending on your family history.  Take these medicines  Aspirin- One aspirin daily can help prevent Heart  disease and Stroke.  Flu shot- Every fall.  Tetanus- Every 10 years.  Zostavax- Once after the age of 60 to prevent Shingles.  Pneumonia shot- Once after the age of 65; if you are younger than 65, ask your healthcare provider if you need a Pneumonia shot.  Take these steps  Don't smoke- If you do smoke, talk to your doctor about quitting.  For tips on how to quit, go to www.smokefree.gov or call 1-800-QUIT-NOW.  Be physically active- Exercise 5 days a week for at least 30 minutes.  If you are not already physically active start slow and gradually work up to 30 minutes of moderate physical activity.  Examples of moderate activity include walking briskly, mowing the yard, dancing, swimming, bicycling, etc.  Eat a healthy diet- Eat a variety of healthy food such as fruits, vegetables, low fat milk, low fat cheese, yogurt, lean meant, poultry, fish, beans, tofu, etc. For more information go to www.thenutritionsource.org  Drink alcohol in moderation- Limit alcohol intake to less than two drinks a day. Never drink and drive.  Dentist- Brush and floss twice daily; visit your dentist twice a year.  Depression- Your emotional health is as important as your physical health. If you're feeling down, or losing interest in things you would normally enjoy please talk to your healthcare provider.  Eye exam- Visit your eye doctor every year.  Safe sex- If you may be exposed to a sexually transmitted infection, use a condom.  Seat belts- Seat belts can save your life; always wear one.  Smoke/Carbon Monoxide detectors- These detectors need to be installed on   the appropriate level of your home.  Replace batteries at least once a year.  Skin cancer- When out in the sun, cover up and use sunscreen 15 SPF or higher.  Violence- If anyone is threatening you, please tell your healthcare provider.  Living Will/ Health care power of attorney- Speak with your healthcare provider and family. 

## 2016-06-03 NOTE — Telephone Encounter (Signed)
Dr Tamala Julian, I did not see that a NS Rx had been sent in or that a specific one was indicated in your notes, so I will send on to you.

## 2016-06-04 MED ORDER — AZELASTINE HCL 0.1 % NA SOLN: 2.0000 | Freq: Two times a day (BID) | NASAL | 12 refills | Status: DC

## 2016-06-07 ENCOUNTER — Encounter: Payer: Self-pay | Admitting: Family Medicine

## 2016-08-02 ENCOUNTER — Encounter: Payer: Self-pay | Admitting: Family Medicine

## 2016-11-15 DIAGNOSIS — I129 Hypertensive chronic kidney disease with stage 1 through stage 4 chronic kidney disease, or unspecified chronic kidney disease: Secondary | ICD-10-CM | POA: Diagnosis not present

## 2016-11-15 DIAGNOSIS — N183 Chronic kidney disease, stage 3 (moderate): Secondary | ICD-10-CM | POA: Diagnosis not present

## 2017-01-14 DIAGNOSIS — N183 Chronic kidney disease, stage 3 (moderate): Secondary | ICD-10-CM | POA: Diagnosis not present

## 2017-02-10 DIAGNOSIS — E875 Hyperkalemia: Secondary | ICD-10-CM | POA: Diagnosis not present

## 2017-04-02 IMAGING — US US RENAL
1 series · 14 of 25 positions shown · non-contrast
Comparison: None.

CLINICAL DATA: 59-year-old male with stage III chronic renal
disease. Initial encounter.

EXAM:
RENAL / URINARY TRACT ULTRASOUND COMPLETE

[Series 1: us renal · 0.32mm/px · 14 of 43 slices shown]
[im 1/43]
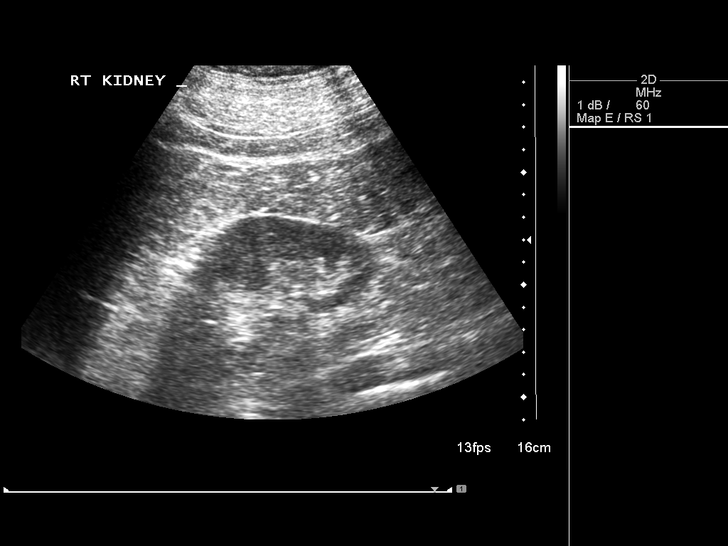
[im 4/43]
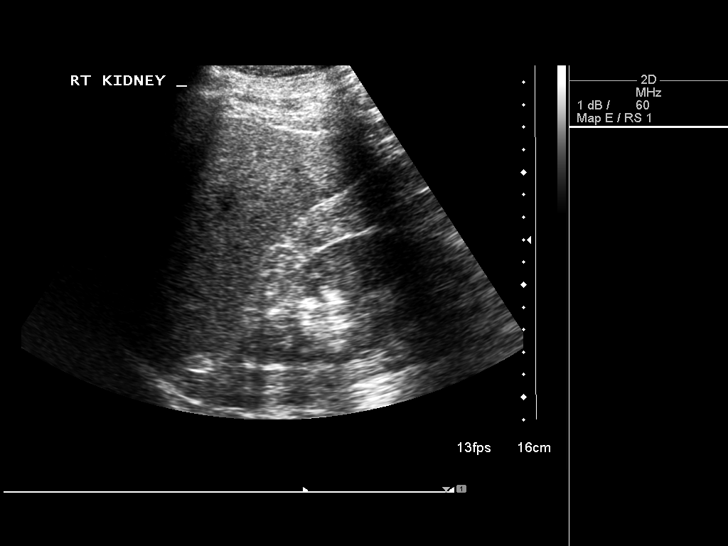
[im 8/43]
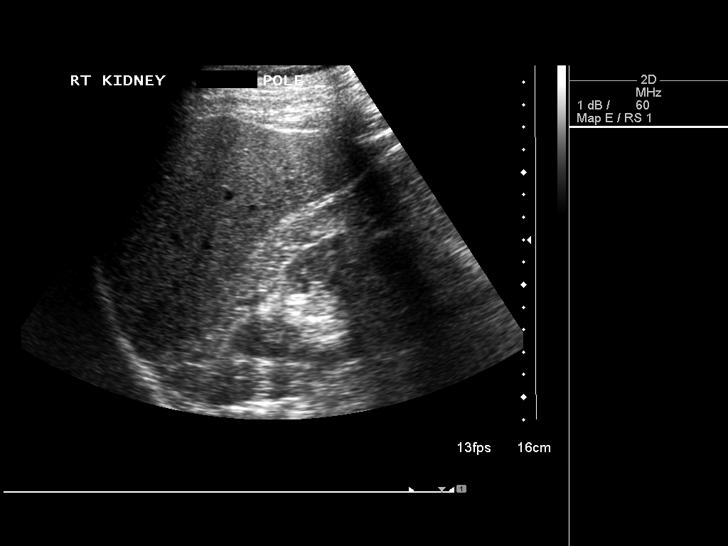
[im 11/43]
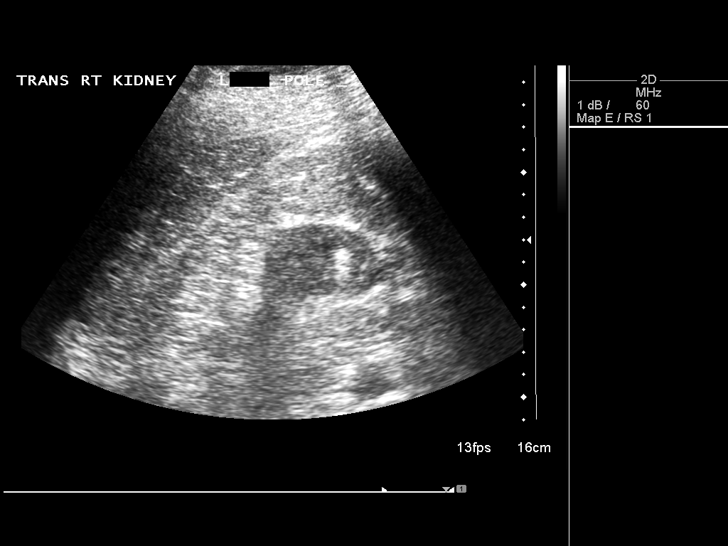
[im 15/43]
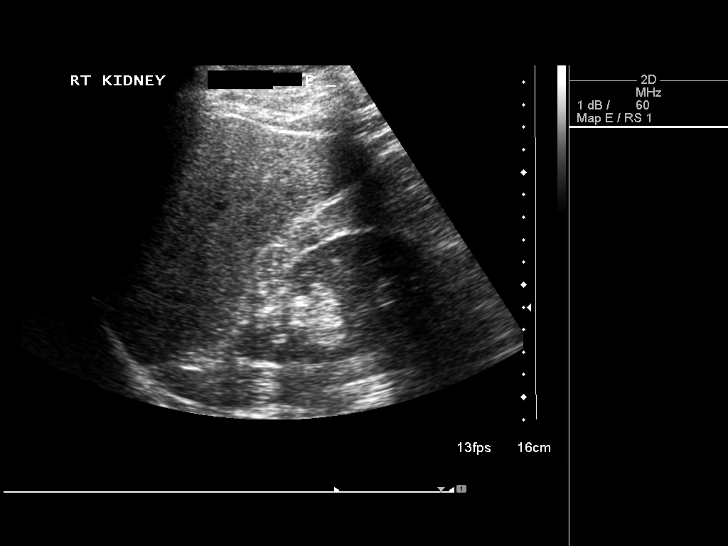
[im 16/43]
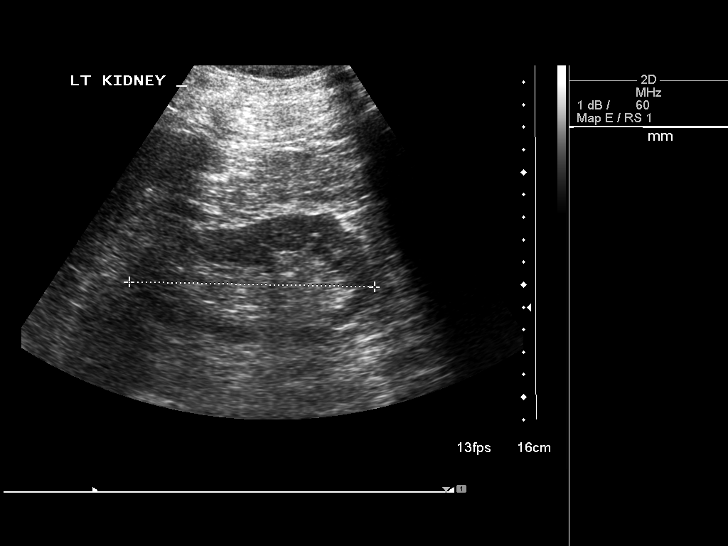
[im 20/43]
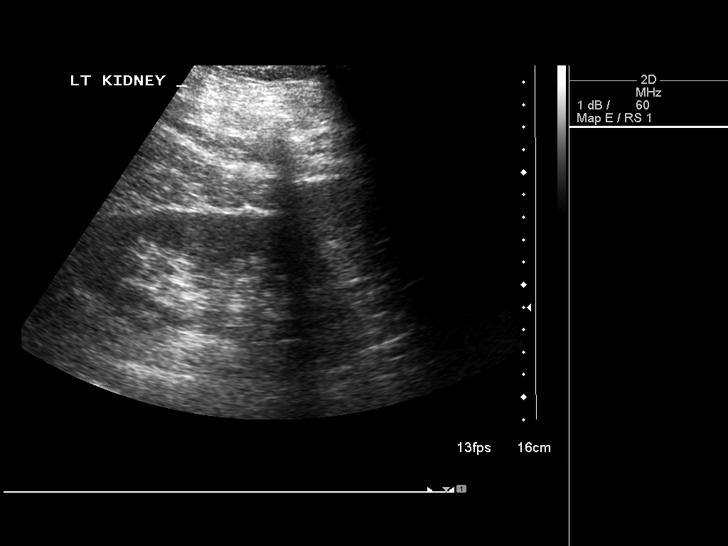
[im 23/43]
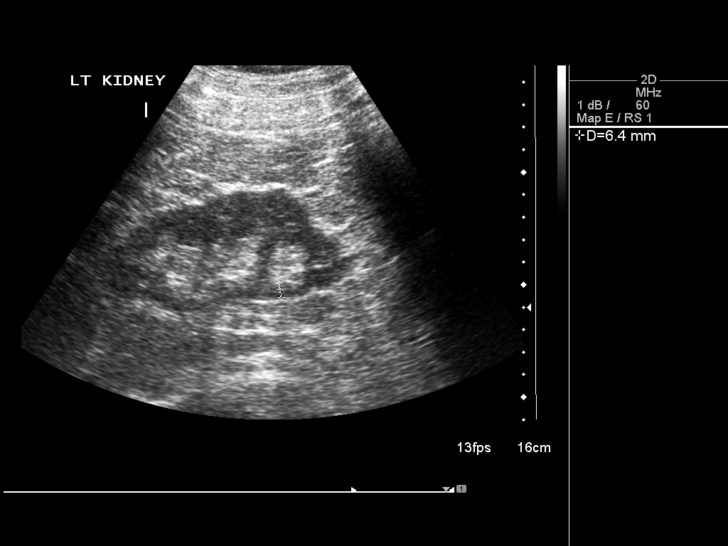
[im 27/43]
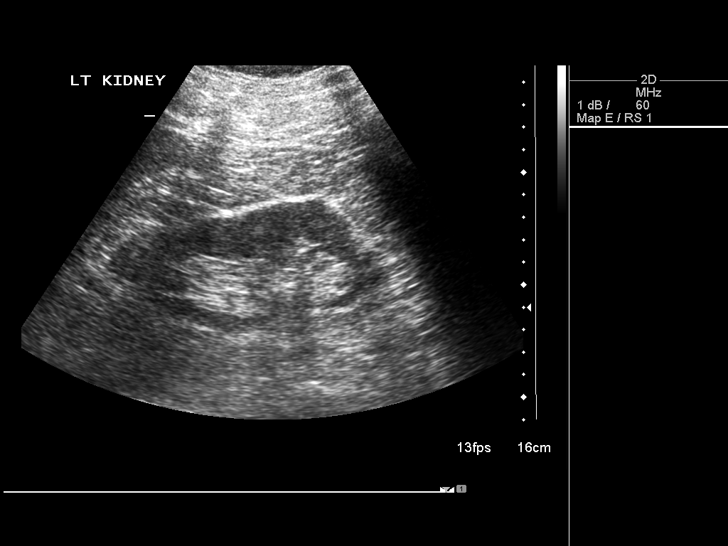
[im 29/43]
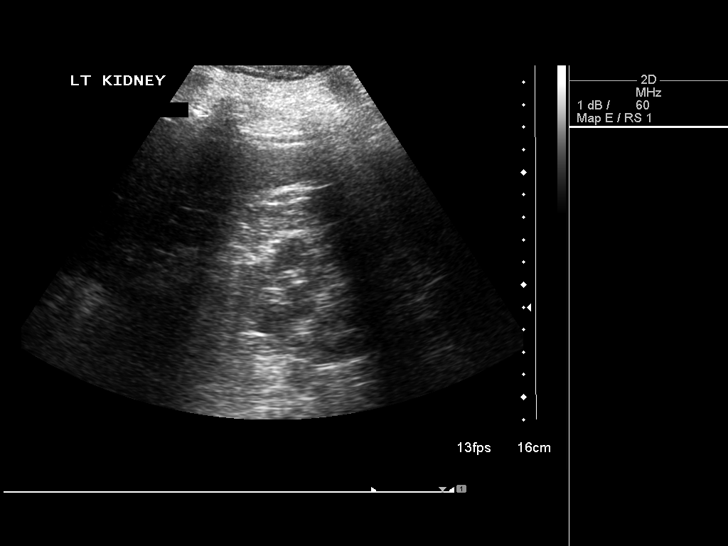
[im 32/43]
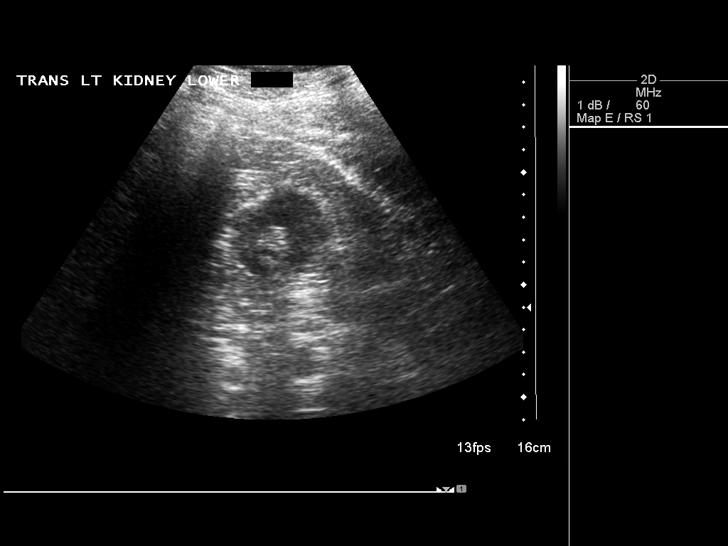
[im 36/43]
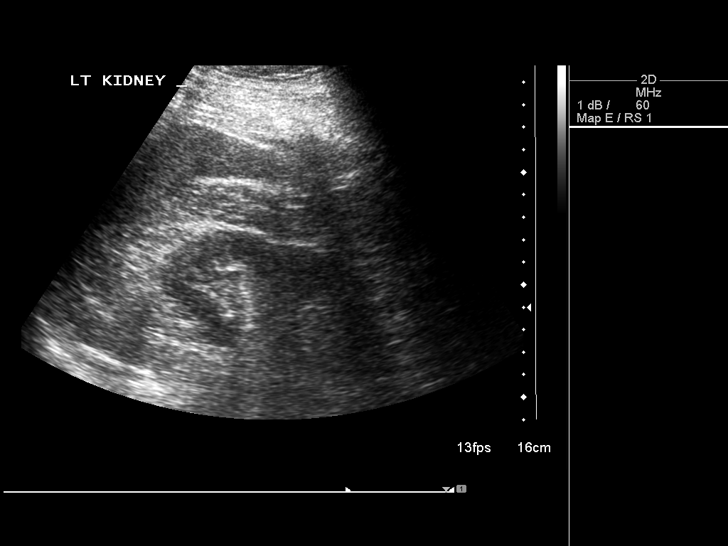
[im 39/43]
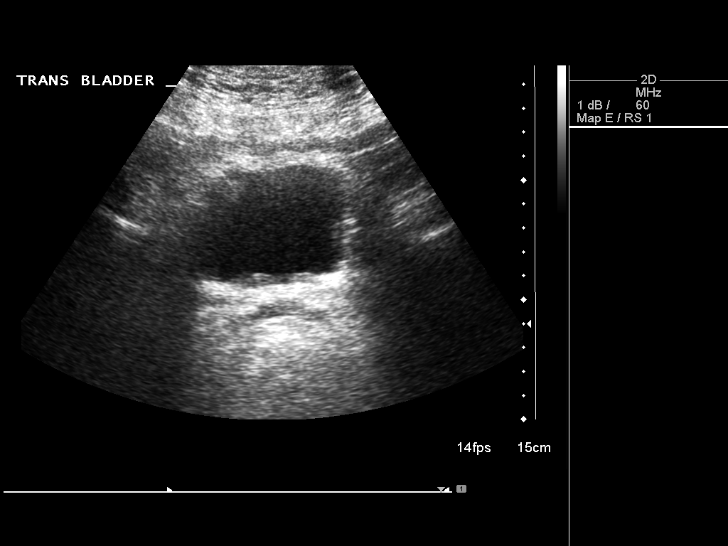
[im 43/43]
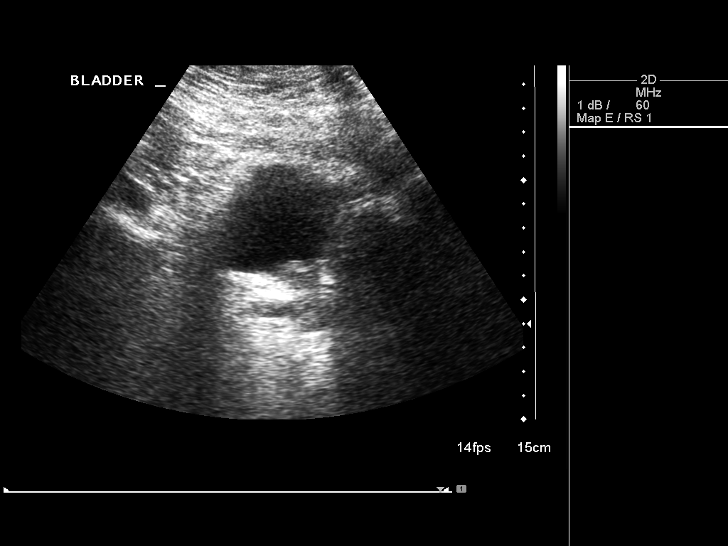

[14 of 25 positions shown; findings below may reference images not displayed]

FINDINGS: Right Kidney:

Length: 11.2 cm. Upper pole cortical thinning/scarring (image 6).
Elsewhere cortical echogenicity within normal limits. No
hydronephrosis or right renal mass.

Left Kidney:

Length: 11.0 cm. Mild ventral cortical thinning (image 24).
Elsewhere cortical echogenicity within normal limits. No
hydronephrosis or left renal mass.

Bladder:

Appears normal for degree of bladder distention.
IMPRESSION: Chronic right greater than left renal cortical scarring. Otherwise
negative sonographic appearance of the kidneys and bladder.

## 2017-04-18 DIAGNOSIS — M25511 Pain in right shoulder: Secondary | ICD-10-CM | POA: Diagnosis not present

## 2017-04-28 ENCOUNTER — Other Ambulatory Visit: Payer: Self-pay | Admitting: Orthopedic Surgery

## 2017-04-28 DIAGNOSIS — M67912 Unspecified disorder of synovium and tendon, left shoulder: Principal | ICD-10-CM

## 2017-04-28 DIAGNOSIS — M67911 Unspecified disorder of synovium and tendon, right shoulder: Secondary | ICD-10-CM

## 2017-05-08 ENCOUNTER — Ambulatory Visit
Admission: RE | Admit: 2017-05-08 | Discharge: 2017-05-08 | Disposition: A | Discharge status: Home / Self Care | Payer: Commercial Managed Care - HMO | Source: Ambulatory Visit | Attending: Orthopedic Surgery | Admitting: Orthopedic Surgery

## 2017-05-08 DIAGNOSIS — M67911 Unspecified disorder of synovium and tendon, right shoulder: Secondary | ICD-10-CM

## 2017-05-08 DIAGNOSIS — M67912 Unspecified disorder of synovium and tendon, left shoulder: Principal | ICD-10-CM

## 2017-05-08 DIAGNOSIS — M25511 Pain in right shoulder: Secondary | ICD-10-CM | POA: Diagnosis not present

## 2017-05-11 DIAGNOSIS — M19111 Post-traumatic osteoarthritis, right shoulder: Secondary | ICD-10-CM | POA: Diagnosis not present

## 2017-06-13 ENCOUNTER — Other Ambulatory Visit: Payer: Self-pay | Admitting: Family Medicine

## 2017-06-13 ENCOUNTER — Encounter: Payer: Self-pay | Admitting: Family Medicine

## 2017-06-13 DIAGNOSIS — Z760 Encounter for issue of repeat prescription: Secondary | ICD-10-CM

## 2017-06-15 ENCOUNTER — Encounter: Payer: Self-pay | Admitting: Family Medicine

## 2017-06-15 ENCOUNTER — Other Ambulatory Visit: Payer: Self-pay

## 2017-06-15 ENCOUNTER — Ambulatory Visit (INDEPENDENT_AMBULATORY_CARE_PROVIDER_SITE_OTHER): Payer: 59 | Admitting: Family Medicine

## 2017-06-15 VITALS — BP 142/80 | HR 85 | Temp 98.0°F | Resp 16 | Ht 74.41 in | Wt 235.0 lb

## 2017-06-15 DIAGNOSIS — Z6829 Body mass index (BMI) 29.0-29.9, adult: Secondary | ICD-10-CM | POA: Diagnosis not present

## 2017-06-15 DIAGNOSIS — G4730 Sleep apnea, unspecified: Secondary | ICD-10-CM

## 2017-06-15 DIAGNOSIS — Z125 Encounter for screening for malignant neoplasm of prostate: Secondary | ICD-10-CM | POA: Diagnosis not present

## 2017-06-15 DIAGNOSIS — N5203 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 dysfunction: Secondary | ICD-10-CM

## 2017-06-15 DIAGNOSIS — Z0000 Encounter for general adult medical examination without abnormal findings: Secondary | ICD-10-CM

## 2017-06-15 DIAGNOSIS — Z23 Encounter for immunization: Secondary | ICD-10-CM | POA: Diagnosis not present

## 2017-06-15 DIAGNOSIS — I10 Essential (primary) hypertension: Secondary | ICD-10-CM | POA: Diagnosis not present

## 2017-06-15 DIAGNOSIS — F5102 Adjustment insomnia: Secondary | ICD-10-CM

## 2017-06-15 DIAGNOSIS — N289 Disorder of kidney and ureter, unspecified: Secondary | ICD-10-CM | POA: Diagnosis not present

## 2017-06-15 LAB — POCT URINALYSIS DIP (MANUAL ENTRY)
Bilirubin, UA: NEGATIVE
Glucose, UA: NEGATIVE mg/dL
Leukocytes, UA: NEGATIVE
Nitrite, UA: NEGATIVE
PH UA: 5 (ref 5.0–8.0)
PROTEIN UA: NEGATIVE mg/dL
SPEC GRAV UA: 1.025 (ref 1.010–1.025)
UROBILINOGEN UA: 0.2 U/dL

## 2017-06-15 MED ORDER — ZOSTER VAC RECOMB ADJUVANTED 50 MCG/0.5ML IM SUSR: 0.5000 mL | Freq: Once | INTRAMUSCULAR | 1 refills | Status: AC

## 2017-06-15 NOTE — Progress Notes (Signed)
Subjective:    Patient ID: Jorge Barker, male    DOB: 13-Feb-1956, 61 y.o.   MRN: [id number]  06/15/2017  Annual Exam    HPI This 61 y.o. male presents for Complete Physical Examination.  Last physical:  06-02-2016 Colonoscopy:  2010 PSA:  05-2016 Eye exam: Dental exam:  R shoulder osteoarthritis: shoulder injury two years ago.  S/p three shoulder injections.  Taking Advil PRN.  Unble to sleep.  Requested shoulder specialist; s/p MRI shoulder; normal rotator cuff.  Taking Tylenol two times daily.    CRI: followed every six months.  [employer] retired.    OSA: compliance with CPAP.    BP Readings from Last 3 Encounters:  06/15/17 (!) 142/80  06/02/16 130/80  04/09/15 (!) 165/96   Wt Readings from Last 3 Encounters:  06/15/17 235 lb (106.6 kg)  06/02/16 228 lb 12.8 oz (103.8 kg)  04/09/15 227 lb (103 kg)   Immunization History  Administered Date(s) Administered  . Tdap 10/28/2011  . Zoster 07/05/2013    Review of Systems  Constitutional: Negative for activity change, appetite change, chills, diaphoresis, fatigue, fever and unexpected weight change.  HENT: Negative for congestion, dental problem, drooling, ear discharge, ear pain, facial swelling, hearing loss, mouth sores, nosebleeds, postnasal drip, rhinorrhea, sinus pressure, sneezing, sore throat, tinnitus, trouble swallowing and voice change.   Eyes: Negative for photophobia, pain, discharge, redness, itching and visual disturbance.  Respiratory: Negative for apnea, cough, choking, chest tightness, shortness of breath, wheezing and stridor.   Cardiovascular: Negative for chest pain, palpitations and leg swelling.  Gastrointestinal: Negative for abdominal pain, blood in stool, constipation, diarrhea, nausea and vomiting.  Endocrine: Negative for cold intolerance, heat intolerance, polydipsia, polyphagia and polyuria.  Genitourinary: Negative for decreased urine volume, difficulty urinating, discharge, dysuria,  enuresis, flank pain, frequency, genital sores, hematuria, penile pain, penile swelling, scrotal swelling, testicular pain and urgency.  Musculoskeletal: Positive for arthralgias. Negative for back pain, gait problem, joint swelling, myalgias, neck pain and neck stiffness.  Skin: Negative for color change, pallor, rash and wound.  Allergic/Immunologic: Positive for environmental allergies. Negative for food allergies and immunocompromised state.  Neurological: Negative for dizziness, tremors, seizures, syncope, facial asymmetry, speech difficulty, weakness, light-headedness, numbness and headaches.  Hematological: Negative for adenopathy. Does not bruise/bleed easily.  Psychiatric/Behavioral: Positive for sleep disturbance. Negative for agitation, behavioral problems, confusion, decreased concentration, dysphoric mood, hallucinations, self-injury and suicidal ideas. The patient is not nervous/anxious and is not hyperactive.        Bedtime eight hours before bedtime.  Around 1000; wakes up 600.    Past Medical History:  Diagnosis Date  . Allergy   . Arthritis    RIGHT shoulder OA  . Chronic renal insufficiency, stage 2 (mild)    [name] every six months.  . Erectile dysfunction   . Hypertension   . Insomnia   . Sleep apnea with use of continuous positive airway pressure (CPAP) 06/04/2013  . Snoring    History reviewed. No pertinent surgical history. Allergies  Allergen Reactions  . Citalopram Hydrobromide     Felt hung-over  . Influenza Vaccines     Serum sickness like illness  . Oxycodone-Acetaminophen     REACTION: severe nausea and vomiting  . Percocet [Oxycodone-Acetaminophen]    Current Outpatient Medications on File Prior to Visit  Medication Sig Dispense Refill  . acyclovir (ZOVIRAX) 400 MG tablet Take 400 mg by mouth 3 (three) times daily.  4  . aspirin EC 81 MG tablet Take 1  tablet (81 mg total) by mouth daily. 30 tablet 11  . irbesartan (AVAPRO) 150 MG tablet Take 150  mg by mouth daily.     No current facility-administered medications on file prior to visit.    Social History   Socioeconomic History  . Marital status: Married    Spouse name: [name]  . Number of children: 2  . Years of education: Assoc.  . Highest education level: Not on file  Social Needs  . Financial resource strain: Not on file  . Food insecurity - worry: Not on file  . Food insecurity - inability: Not on file  . Transportation needs - medical: Not on file  . Transportation needs - non-medical: Not on file  Occupational History  . Occupation: drive bus part time    Employer: RETIRED  Tobacco Use  . Smoking status: Former Smoker    Years: 12.00    Types: Cigarettes    Last attempt to quit: 07/05/1981    Years since quitting: 35.9  . Smokeless tobacco: Never Used  Substance and Sexual Activity  . Alcohol use: Yes    Comment: drinks beer and spirit (RUM)/10oz weekly  . Drug use: No  . Sexual activity: Yes    Birth control/protection: Post-menopausal  Other Topics Concern  . Not on file  Social History Narrative   Patient is married ([name]) and lives at home with his wife.  Married x 1977.   Patient has two adult children.  Four grandchildren.      Lives: with wife.   Married. Education: [employer]. Exercise: Walk daily 30-60 minutes. Patient is right handed and Consumes 1-2 cups of caffiene daily.      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       Tobacco; quit 35 years ago; smoked for 14-30 yo.      Alcohol: 3-7drinks per week       Exercise:  None in 2018; active in yard; yard is 1 acre.                    Family History  Problem Relation Age of Onset  . Cancer Mother        intestinal cancer/abdomen       Objective:    BP (!) 142/80   Pulse 85   Temp 98 F (36.7 C) (Oral)   Resp 16   Ht 6' 2.41" (1.89 m)   Wt 235 lb (106.6 kg)    SpO2 95%   BMI 29.84 kg/m  Physical Exam  Constitutional: He is oriented to person, place, and time. He appears well-developed and well-nourished. No distress.  HENT:  Head: Normocephalic and atraumatic.  Right Ear: External ear normal.  Left Ear: External ear normal.  Nose: Nose normal.  Mouth/Throat: Oropharynx is clear and moist.  Eyes: Conjunctivae and EOM are normal. Pupils are equal, round, and reactive to light.  Neck: Normal range of motion. Neck supple. Carotid bruit is not present. No thyromegaly present.  Cardiovascular: Normal rate, regular rhythm, normal heart sounds and intact distal pulses. Exam reveals no gallop and no friction rub.  No murmur heard. Pulmonary/Chest: Effort normal and breath sounds normal. He has no wheezes. He has no rales.  Abdominal: Soft. Bowel sounds are normal. He exhibits no distension and no mass. There is no tenderness. There is no rebound and no  guarding. Hernia confirmed negative in the right inguinal area and confirmed negative in the left inguinal area.  Genitourinary: Testes normal and penis normal. Right testis shows no mass, no swelling and no tenderness. Left testis shows no mass, no swelling and no tenderness.  Musculoskeletal:       Right shoulder: Normal.       Left shoulder: Normal.       Cervical back: Normal.  Lymphadenopathy:    He has no cervical adenopathy.       Right: No inguinal adenopathy present.       Left: No inguinal adenopathy present.  Neurological: He is alert and oriented to person, place, and time. He has normal reflexes. No cranial nerve deficit. He exhibits normal muscle tone. Coordination normal.  Skin: Skin is warm and dry. No rash noted. He is not diaphoretic.  Psychiatric: He has a normal mood and affect. His behavior is normal. Judgment and thought content normal.   No results found. Depression screen [facility] 2/9 06/15/2017 06/02/2016 04/09/2015 02/18/2014  Decreased Interest 0 0 0 0  Down, Depressed,  Hopeless 0 0 0 0  PHQ - 2 Score 0 0 0 0   Fall Risk  06/15/2017 06/02/2016 04/09/2015  Falls in the past year? No No No    Functional Status Survey: Is the patient deaf or have difficulty hearing?: No Does the patient have difficulty seeing, even when wearing glasses/contacts?: No Does the patient have difficulty concentrating, remembering, or making decisions?: No Does the patient have difficulty walking or climbing stairs?: No Does the patient have difficulty dressing or bathing?: No Does the patient have difficulty doing errands alone such as visiting a doctor's office or shopping?: No     Assessment & Plan:   1. Routine physical examination   2. Renal insufficiency   3. Screening for prostate cancer   4. Essential hypertension   5. Sleep apnea with use of continuous positive airway pressure (CPAP)   6. Adjustment insomnia   7. 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 dysfunction   8. Need for shingles vaccine   9. BMI 29.0-29.9,adult    -anticipatory guidance provided --- exercise, weight loss, safe driving practices, aspirin 81mg  daily. -obtain age appropriate screening labs and labs for chronic disease management. -Followed by nephrology yearly for chronic renal insufficiency. -Blood pressure elevated today which is unusual for patient.  Continue current dose of irbesartan 150 mg daily.  Recommend checking blood pressure regularly at home. -Patient reports good compliance with CPAP therapy-for obstructive sleep apnea. --recommend weight loss, exercise for 30-60 minutes five days per week; recommend 8200 kcal restriction per day with a minimum of 60 grams of protein per day.    Orders Placed This Encounter  Procedures  . CBC with Differential/Platelet  . Comprehensive metabolic panel    Order Specific Question:   Has the patient fasted?    Answer:   No  . Lipid panel    Order Specific Question:   Has the patient fasted?    Answer:   No  .  TSH  . PSA  . POCT urinalysis dipstick   Meds ordered this encounter  Medications  . Zoster Vaccine Adjuvanted [facility]) injection    Sig: Inject 0.5 mLs into the muscle once for 1 dose.    Dispense:  0.5 mL    Refill:  1  . zolpidem (AMBIEN) 10 MG tablet    Sig: Take 1 tablet (10 mg total) by mouth at bedtime as needed for  sleep.    Dispense:  30 tablet    Refill:  3  . tadalafil (CIALIS) 20 MG tablet    Sig: TAKE 1/2-1 TAB BY MOUTH EVERY DAY AS NEEDED    Dispense:  5 tablet    Refill:  11  . azelastine (ASTELIN) 0.1 % nasal spray    Sig: Place 2 sprays into both nostrils 2 (two) times daily. Use in each nostril as directed    Dispense:  30 mL    Refill:  12    Return in about 1 year (around 06/15/2018) for complete physical examiniation.   Bilan Tedesco Elayne Guerin, M.D. [facility] [address] [place], [place]  [address] [phone] phone [phone] fax

## 2017-06-15 NOTE — Patient Instructions (Addendum)
   IF you received an x-ray today, you will receive an invoice from [facility]. Please contact [facility] at [phone] with questions or concerns regarding your invoice.   IF you received labwork today, you will receive an invoice from [facility]. Please contact [facility] at [phone] with questions or concerns regarding your invoice.   Our billing staff will not be able to assist you with questions regarding bills from these companies.  You will be contacted with the lab results as soon as they are available. The fastest way to get your results is to activate your My Chart account. Instructions are located on the last page of this paperwork. If you have not heard from us regarding the results in 2 weeks, please contact this office.      Preventive Care 40-64 Years, Male Preventive care refers to lifestyle choices and visits with your health care provider that can promote health and wellness. What does preventive care include?  A yearly physical exam. This is also called an annual well check.  Dental exams once or twice a year.  Routine eye exams. Ask your health care provider how often you should have your eyes checked.  Personal lifestyle choices, including: ? Daily care of your teeth and gums. ? Regular physical activity. ? Eating a healthy diet. ? Avoiding tobacco and drug use. ? Limiting alcohol use. ? Practicing safe sex. ? Taking low-dose aspirin every day starting at age 50. What happens during an annual well check? The services and screenings done by your health care provider during your annual well check will depend on your age, overall health, lifestyle risk factors, and family history of disease. Counseling Your health care provider may ask you questions about your:  Alcohol use.  Tobacco use.  Drug use.  Emotional well-being.  Home and relationship well-being.  Sexual activity.  Eating habits.  Work and work  environment.  Screening You may have the following tests or measurements:  Height, weight, and BMI.  Blood pressure.  Lipid and cholesterol levels. These may be checked every 5 years, or more frequently if you are over 50 years old.  Skin check.  Lung cancer screening. You may have this screening every year starting at age 55 if you have a 30-pack-year history of smoking and currently smoke or have quit within the past 15 years.  Fecal occult blood test (FOBT) of the stool. You may have this test every year starting at age 50.  Flexible sigmoidoscopy or colonoscopy. You may have a sigmoidoscopy every 5 years or a colonoscopy every 10 years starting at age 50.  Prostate cancer screening. Recommendations will vary depending on your family history and other risks.  Hepatitis C blood test.  Hepatitis B blood test.  Sexually transmitted disease (STD) testing.  Diabetes screening. This is done by checking your blood sugar (glucose) after you have not eaten for a while (fasting). You may have this done every 1-3 years.  Discuss your test results, treatment options, and if necessary, the need for more tests with your health care provider. Vaccines Your health care provider may recommend certain vaccines, such as:  Influenza vaccine. This is recommended every year.  Tetanus, diphtheria, and acellular pertussis (Tdap, Td) vaccine. You may need a Td booster every 10 years.  Varicella vaccine. You may need this if you have not been vaccinated.  Zoster vaccine. You may need this after age 60.  Measles, mumps, and rubella (MMR) vaccine. You may need at least one dose   of MMR if you were born in 1957 or later. You may also need a second dose.  Pneumococcal 13-valent conjugate (PCV13) vaccine. You may need this if you have certain conditions and have not been vaccinated.  Pneumococcal polysaccharide (PPSV23) vaccine. You may need one or two doses if you smoke cigarettes or if you have  certain conditions.  Meningococcal vaccine. You may need this if you have certain conditions.  Hepatitis A vaccine. You may need this if you have certain conditions or if you travel or work in places where you may be exposed to hepatitis A.  Hepatitis B vaccine. You may need this if you have certain conditions or if you travel or work in places where you may be exposed to hepatitis B.  Haemophilus influenzae type b (Hib) vaccine. You may need this if you have certain risk factors.  Talk to your health care provider about which screenings and vaccines you need and how often you need them. This information is not intended to replace advice given to you by your health care provider. Make sure you discuss any questions you have with your health care provider. Document Released: 07/18/2015 Document Revised: 03/10/2016 Document Reviewed: 04/22/2015 Elsevier Interactive Patient Education  2017 Elsevier Inc.  

## 2017-06-16 LAB — COMPREHENSIVE METABOLIC PANEL
A/G RATIO: 1.9 (ref 1.2–2.2)
ALBUMIN: 4.3 g/dL (ref 3.6–4.8)
ALK PHOS: 55 IU/L (ref 39–117)
ALT: 25 IU/L (ref 0–44)
AST: 22 IU/L (ref 0–40)
BILIRUBIN TOTAL: 0.5 mg/dL (ref 0.0–1.2)
BUN / CREAT RATIO: 16 (ref 10–24)
BUN: 20 mg/dL (ref 8–27)
CO2: 23 mmol/L (ref 20–29)
CREATININE: 1.26 mg/dL (ref 0.76–1.27)
Calcium: 9.4 mg/dL (ref 8.6–10.2)
Chloride: 104 mmol/L (ref 96–106)
GFR calc Af Amer: 70 mL/min/{1.73_m2} (ref >59)
GFR calc non Af Amer: 60 mL/min/{1.73_m2} (ref >59)
GLOBULIN, TOTAL: 2.3 g/dL (ref 1.5–4.5)
Glucose: 105 mg/dL — ABNORMAL HIGH (ref 65–99)
Potassium: 5.1 mmol/L (ref 3.5–5.2)
SODIUM: 142 mmol/L (ref 134–144)
Total Protein: 6.6 g/dL (ref 6.0–8.5)

## 2017-06-16 LAB — LIPID PANEL
CHOLESTEROL TOTAL: 182 mg/dL (ref 100–199)
Chol/HDL Ratio: 3.3 ratio (ref 0.0–5.0)
HDL: 55 mg/dL (ref >39)
LDL CALC: 104 mg/dL — AB (ref 0–99)
Triglycerides: 114 mg/dL (ref 0–149)
VLDL Cholesterol Cal: 23 mg/dL (ref 5–40)

## 2017-06-16 LAB — CBC WITH DIFFERENTIAL/PLATELET
BASOS: 0 %
Basophils Absolute: 0 10*3/uL (ref 0.0–0.2)
EOS (ABSOLUTE): 0.1 10*3/uL (ref 0.0–0.4)
EOS: 2 %
HEMATOCRIT: 45.1 % (ref 37.5–51.0)
HEMOGLOBIN: 15.8 g/dL (ref 13.0–17.7)
Immature Grans (Abs): 0 10*3/uL (ref 0.0–0.1)
Immature Granulocytes: 0 %
LYMPHS ABS: 2.6 10*3/uL (ref 0.7–3.1)
Lymphs: 35 %
MCH: 30.1 pg (ref 26.6–33.0)
MCHC: 35 g/dL (ref 31.5–35.7)
MCV: 86 fL (ref 79–97)
MONOCYTES: 10 %
Monocytes Absolute: 0.7 10*3/uL (ref 0.1–0.9)
NEUTROS ABS: 3.9 10*3/uL (ref 1.4–7.0)
Neutrophils: 53 %
Platelets: 276 10*3/uL (ref 150–379)
RBC: 5.25 x10E6/uL (ref 4.14–5.80)
RDW: 13.7 % (ref 12.3–15.4)
WBC: 7.4 10*3/uL (ref 3.4–10.8)

## 2017-06-16 LAB — TSH: TSH: 1.79 u[IU]/mL (ref 0.450–4.500)

## 2017-06-16 LAB — PSA: PROSTATE SPECIFIC AG, SERUM: 1 ng/mL (ref 0.0–4.0)

## 2017-06-21 ENCOUNTER — Encounter: Payer: Self-pay | Admitting: Family Medicine

## 2017-06-21 DIAGNOSIS — Z6829 Body mass index (BMI) 29.0-29.9, adult: Secondary | ICD-10-CM | POA: Insufficient documentation

## 2017-06-21 MED ORDER — AZELASTINE HCL 0.1 % NA SOLN: 2.0000 | Freq: Two times a day (BID) | NASAL | 12 refills | Status: DC

## 2017-06-21 MED ORDER — TADALAFIL 20 MG PO TABS: ORAL_TABLET | ORAL | 11 refills | Status: DC

## 2017-06-21 MED ORDER — ZOLPIDEM TARTRATE 10 MG PO TABS: 10.0000 mg | ORAL_TABLET | Freq: Every evening | ORAL | 3 refills | Status: DC | PRN

## 2017-07-26 DIAGNOSIS — I129 Hypertensive chronic kidney disease with stage 1 through stage 4 chronic kidney disease, or unspecified chronic kidney disease: Secondary | ICD-10-CM | POA: Diagnosis not present

## 2017-07-26 DIAGNOSIS — N183 Chronic kidney disease, stage 3 (moderate): Secondary | ICD-10-CM | POA: Diagnosis not present

## 2017-09-05 DIAGNOSIS — K5792 Diverticulitis of intestine, part unspecified, without perforation or abscess without bleeding: Secondary | ICD-10-CM | POA: Diagnosis not present

## 2017-09-05 DIAGNOSIS — R2243 Localized swelling, mass and lump, lower limb, bilateral: Secondary | ICD-10-CM | POA: Diagnosis not present

## 2017-09-05 DIAGNOSIS — E876 Hypokalemia: Secondary | ICD-10-CM | POA: Diagnosis not present

## 2017-09-05 DIAGNOSIS — D72829 Elevated white blood cell count, unspecified: Secondary | ICD-10-CM | POA: Diagnosis not present

## 2017-09-06 DIAGNOSIS — E876 Hypokalemia: Secondary | ICD-10-CM | POA: Diagnosis not present

## 2017-09-06 DIAGNOSIS — D72829 Elevated white blood cell count, unspecified: Secondary | ICD-10-CM | POA: Diagnosis not present

## 2017-09-06 DIAGNOSIS — K5792 Diverticulitis of intestine, part unspecified, without perforation or abscess without bleeding: Secondary | ICD-10-CM | POA: Diagnosis not present

## 2017-09-07 DIAGNOSIS — E876 Hypokalemia: Secondary | ICD-10-CM | POA: Diagnosis not present

## 2017-09-07 DIAGNOSIS — K5792 Diverticulitis of intestine, part unspecified, without perforation or abscess without bleeding: Secondary | ICD-10-CM | POA: Diagnosis not present

## 2017-09-07 DIAGNOSIS — D72829 Elevated white blood cell count, unspecified: Secondary | ICD-10-CM | POA: Diagnosis not present

## 2017-09-12 DIAGNOSIS — K5792 Diverticulitis of intestine, part unspecified, without perforation or abscess without bleeding: Secondary | ICD-10-CM | POA: Diagnosis not present

## 2017-09-12 DIAGNOSIS — Z09 Encounter for follow-up examination after completed treatment for conditions other than malignant neoplasm: Secondary | ICD-10-CM | POA: Diagnosis not present

## 2017-11-29 ENCOUNTER — Encounter: Payer: Self-pay | Admitting: Family Medicine

## 2017-12-12 DIAGNOSIS — D1801 Hemangioma of skin and subcutaneous tissue: Secondary | ICD-10-CM | POA: Diagnosis not present

## 2017-12-12 DIAGNOSIS — D225 Melanocytic nevi of trunk: Secondary | ICD-10-CM | POA: Diagnosis not present

## 2017-12-12 DIAGNOSIS — L814 Other melanin hyperpigmentation: Secondary | ICD-10-CM | POA: Diagnosis not present

## 2017-12-13 DIAGNOSIS — H524 Presbyopia: Secondary | ICD-10-CM | POA: Diagnosis not present

## 2018-01-03 DIAGNOSIS — Z23 Encounter for immunization: Secondary | ICD-10-CM | POA: Diagnosis not present

## 2018-01-20 ENCOUNTER — Other Ambulatory Visit: Payer: Self-pay | Admitting: Family Medicine

## 2018-01-20 DIAGNOSIS — F5102 Adjustment insomnia: Secondary | ICD-10-CM

## 2018-05-04 DIAGNOSIS — Z23 Encounter for immunization: Secondary | ICD-10-CM | POA: Diagnosis not present

## 2018-07-20 ENCOUNTER — Other Ambulatory Visit: Payer: Self-pay | Admitting: Emergency Medicine

## 2018-07-20 DIAGNOSIS — N5203 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 dysfunction: Secondary | ICD-10-CM

## 2018-07-20 NOTE — Telephone Encounter (Signed)
Patient called and he says he has an appointment already set up. I advised the prescription is expired, so it will have to be sent to Dr. Mitchel Honour for refill and it could take up to 3 days, he verbalized understanding.

## 2018-07-20 NOTE — Telephone Encounter (Signed)
Copied from [place] [id number]. Topic: Quick Communication - Rx Refill/Question >> Jul 20, 2018  4:42 PM Keene Breath wrote: Medication: tadalafil (CIALIS) 20 MG tablet  Patient called to request a refill for the above medication  Preferred Pharmacy (with phone number or street name): [facility] [place] [place] [facility] [address] [phone] (Phone) [phone] (Fax)

## 2018-07-21 NOTE — Telephone Encounter (Signed)
Please Advise

## 2018-07-21 NOTE — Telephone Encounter (Signed)
Needs OV to establish care.

## 2018-07-31 ENCOUNTER — Encounter: Payer: Self-pay | Admitting: Emergency Medicine

## 2018-07-31 ENCOUNTER — Ambulatory Visit (INDEPENDENT_AMBULATORY_CARE_PROVIDER_SITE_OTHER): Payer: 59 | Admitting: Emergency Medicine

## 2018-07-31 ENCOUNTER — Other Ambulatory Visit: Payer: Self-pay

## 2018-07-31 VITALS — BP 168/83 | HR 74 | Temp 98.4°F | Resp 16 | Ht 73.0 in | Wt 238.8 lb

## 2018-07-31 DIAGNOSIS — I10 Essential (primary) hypertension: Secondary | ICD-10-CM | POA: Diagnosis not present

## 2018-07-31 DIAGNOSIS — G4730 Sleep apnea, unspecified: Secondary | ICD-10-CM

## 2018-07-31 DIAGNOSIS — Z0001 Encounter for general adult medical examination with abnormal findings: Secondary | ICD-10-CM

## 2018-07-31 DIAGNOSIS — F5102 Adjustment insomnia: Secondary | ICD-10-CM

## 2018-07-31 DIAGNOSIS — N5203 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 dysfunction: Secondary | ICD-10-CM

## 2018-07-31 DIAGNOSIS — N289 Disorder of kidney and ureter, unspecified: Secondary | ICD-10-CM | POA: Diagnosis not present

## 2018-07-31 DIAGNOSIS — Z0000 Encounter for general adult medical examination without abnormal findings: Secondary | ICD-10-CM

## 2018-07-31 MED ORDER — TADALAFIL 20 MG PO TABS: ORAL_TABLET | ORAL | 11 refills | Status: DC

## 2018-07-31 MED ORDER — ZOLPIDEM TARTRATE 10 MG PO TABS: 10.0000 mg | ORAL_TABLET | Freq: Every evening | ORAL | 5 refills | Status: DC | PRN

## 2018-07-31 NOTE — Patient Instructions (Addendum)
If you have lab work done today you will be contacted with your lab results within the next 2 weeks.  If you have not heard from [place] then please contact us. The fastest way to get your results is to register for My Chart.   IF you received an x-ray today, you will receive an invoice from [facility]. Please contact [facility] at [phone] with questions or concerns regarding your invoice.   IF you received labwork today, you will receive an invoice from [place]. Please contact [facility] at [phone] with questions or concerns regarding your invoice.   Our billing staff will not be able to assist you with questions regarding bills from these companies.  You will be contacted with the lab results as soon as they are available. The fastest way to get your results is to activate your My Chart account. Instructions are located on the last page of this paperwork. If you have not heard from [place] regarding the results in 2 weeks, please contact this office.     Hypertension Hypertension, commonly called high blood pressure, is when the force of blood pumping through the arteries is too strong. The arteries are the blood vessels that carry blood from the heart throughout the body. Hypertension forces the heart to work harder to pump blood and may cause arteries to become narrow or stiff. Having untreated or uncontrolled hypertension can cause heart attacks, strokes, kidney disease, and other problems. A blood pressure reading consists of a higher number over a lower number. Ideally, your blood pressure should be below 120/80. The first ("top") number is called the systolic pressure. It is a measure of the pressure in your arteries as your heart beats. The second ("bottom") number is called the diastolic pressure. It is a measure of the pressure in your arteries as the heart relaxes. What are the causes? The cause of this condition is not known. What increases the risk? Some  risk factors for high blood pressure are under your control. Others are not. Factors you can change  Smoking.  Having type 2 diabetes mellitus, high cholesterol, or both.  Not getting enough exercise or physical activity.  Being overweight.  Having too much fat, sugar, calories, or salt (sodium) in your diet.  Drinking too much alcohol. Factors that are difficult or impossible to change  Having chronic kidney disease.  Having a family history of high blood pressure.  Age. Risk increases with age.  Race. You may be at higher risk if you are African-American.  Gender. Men are at higher risk than women before age 2. After age 73, women are at higher risk than men.  Having obstructive sleep apnea.  Stress. What are the signs or symptoms? Extremely high blood pressure (hypertensive crisis) may cause:  Headache.  Anxiety.  Shortness of breath.  Nosebleed.  Nausea and vomiting.  Severe chest pain.  Jerky movements you cannot control (seizures). How is this diagnosed? This condition is diagnosed by measuring your blood pressure while you are seated, with your arm resting on a surface. The cuff of the blood pressure monitor will be placed directly against the skin of your upper arm at the level of your heart. It should be measured at least twice using the same arm. Certain conditions can cause a difference in blood pressure between your right and left arms. Certain factors can cause blood pressure readings to be lower or higher than normal (elevated) for a short period of time:  When your  blood pressure is higher when you are in a health care provider's office than when you are at home, this is called white coat hypertension. Most people with this condition do not need medicines.  When your blood pressure is higher at home than when you are in a health care provider's office, this is called masked hypertension. Most people with this condition may need medicines to control  blood pressure. If you have a high blood pressure reading during one visit or you have normal blood pressure with other risk factors:  You may be asked to return on a different day to have your blood pressure checked again.  You may be asked to monitor your blood pressure at home for 1 week or longer. If you are diagnosed with hypertension, you may have other blood or imaging tests to help your health care provider understand your overall risk for other conditions. How is this treated? This condition is treated by making healthy lifestyle changes, such as eating healthy foods, exercising more, and reducing your alcohol intake. Your health care provider may prescribe medicine if lifestyle changes are not enough to get your blood pressure under control, and if:  Your systolic blood pressure is above 130.  Your diastolic blood pressure is above 80. Your personal target blood pressure may vary depending on your medical conditions, your age, and other factors. Follow these instructions at home: Eating and drinking   Eat a diet that is high in fiber and potassium, and low in sodium, added sugar, and fat. An example eating plan is called the DASH (Dietary Approaches to Stop Hypertension) diet. To eat this way: ? Eat plenty of fresh fruits and vegetables. Try to fill half of your plate at each meal with fruits and vegetables. ? Eat whole grains, such as whole wheat pasta, brown rice, or whole grain bread. Fill about one quarter of your plate with whole grains. ? Eat or drink low-fat dairy products, such as skim milk or low-fat yogurt. ? Avoid fatty cuts of meat, processed or cured meats, and poultry with skin. Fill about one quarter of your plate with lean proteins, such as fish, chicken without skin, beans, eggs, and tofu. ? Avoid premade and processed foods. These tend to be higher in sodium, added sugar, and fat.  Reduce your daily sodium intake. Most people with hypertension should eat less than  1,500 mg of sodium a day.  Limit alcohol intake to no more than 1 drink a day for nonpregnant women and 2 drinks a day for men. One drink equals 12 oz of beer, 5 oz of wine, or 1 oz of hard liquor. Lifestyle   Work with your health care provider to maintain a healthy body weight or to lose weight. Ask what an ideal weight is for you.  Get at least 30 minutes of exercise that causes your heart to beat faster (aerobic exercise) most days of the week. Activities may include walking, swimming, or biking.  Include exercise to strengthen your muscles (resistance exercise), such as pilates or lifting weights, as part of your weekly exercise routine. Try to do these types of exercises for 30 minutes at least 3 days a week.  Do not use any products that contain nicotine or tobacco, such as cigarettes and e-cigarettes. If you need help quitting, ask your health care provider.  Monitor your blood pressure at home as told by your health care provider.  Keep all follow-up visits as told by your health care provider. This is  important. Medicines  Take over-the-counter and prescription medicines only as told by your health care provider. Follow directions carefully. Blood pressure medicines must be taken as prescribed.  Do not skip doses of blood pressure medicine. Doing this puts you at risk for problems and can make the medicine less effective.  Ask your health care provider about side effects or reactions to medicines that you should watch for. Contact a health care provider if:  You think you are having a reaction to a medicine you are taking.  You have headaches that keep coming back (recurring).  You feel dizzy.  You have swelling in your ankles.  You have trouble with your vision. Get help right away if:  You develop a severe headache or confusion.  You have unusual weakness or numbness.  You feel faint.  You have severe pain in your chest or abdomen.  You vomit  repeatedly.  You have trouble breathing. Summary  Hypertension is when the force of blood pumping through your arteries is too strong. If this condition is not controlled, it may put you at risk for serious complications.  Your personal target blood pressure may vary depending on your medical conditions, your age, and other factors. For most people, a normal blood pressure is less than 120/80.  Hypertension is treated with lifestyle changes, medicines, or a combination of both. Lifestyle changes include weight loss, eating a healthy, low-sodium diet, exercising more, and limiting alcohol. This information is not intended to replace advice given to you by your health care provider. Make sure you discuss any questions you have with your health care provider. Document Released: 06/21/2005 Document Revised: 05/19/2016 Document Reviewed: 05/19/2016 Elsevier Interactive Patient Education  2019 [employer] Maintenance, Male A healthy lifestyle and preventive care is important for your health and wellness. Ask your health care provider about what schedule of regular examinations is right for you. What should I know about weight and diet? Eat a Healthy Diet  Eat plenty of vegetables, fruits, whole grains, low-fat dairy products, and lean protein.  Do not eat a lot of foods high in solid fats, added sugars, or salt.  Maintain a Healthy Weight Regular exercise can help you achieve or maintain a healthy weight. You should:  Do at least 150 minutes of exercise each week. The exercise should increase your heart rate and make you sweat (moderate-intensity exercise).  Do strength-training exercises at least twice a week. Watch Your Levels of Cholesterol and Blood Lipids  Have your blood tested for lipids and cholesterol every 5 years starting at 65 years of age. If you are at high risk for heart disease, you should start having your blood tested when you are 65 years old. You may need to  have your cholesterol levels checked more often if: ? Your lipid or cholesterol levels are high. ? You are older than 65 years of age. ? You are at high risk for heart disease. What should I know about cancer screening? Many types of cancers can be detected early and may often be prevented. Lung Cancer  You should be screened every year for lung cancer if: ? You are a current smoker who has smoked for at least 30 years. ? You are a former smoker who has quit within the past 15 years.  Talk to your health care provider about your screening options, when you should start screening, and how often you should be screened. Colorectal Cancer  Routine colorectal cancer screening usually begins at 65  years of age and should be repeated every 5-10 years until you are 65 years old. You may need to be screened more often if early forms of precancerous polyps or small growths are found. Your health care provider may recommend screening at an earlier age if you have risk factors for colon cancer.  Your health care provider may recommend using home test kits to check for hidden blood in the stool.  A small camera at the end of a tube can be used to examine your colon (sigmoidoscopy or colonoscopy). This checks for the earliest forms of colorectal cancer. Prostate and Testicular Cancer  Depending on your age and overall health, your health care provider may do certain tests to screen for prostate and testicular cancer.  Talk to your health care provider about any symptoms or concerns you have about testicular or prostate cancer. Skin Cancer  Check your skin from head to toe regularly.  Tell your health care provider about any new moles or changes in moles, especially if: ? There is a change in a mole's size, shape, or color. ? You have a mole that is larger than a pencil eraser.  Always use sunscreen. Apply sunscreen liberally and repeat throughout the day.  Protect yourself by wearing long  sleeves, pants, a wide-brimmed hat, and sunglasses when outside. What should I know about heart disease, diabetes, and high blood pressure?  If you are 93-8 years of age, have your blood pressure checked every 3-5 years. If you are 100 years of age or older, have your blood pressure checked every year. You should have your blood pressure measured twice-once when you are at a hospital or clinic, and once when you are not at a hospital or clinic. Record the average of the two measurements. To check your blood pressure when you are not at a hospital or clinic, you can use: ? An automated blood pressure machine at a pharmacy. ? A home blood pressure monitor.  Talk to your health care provider about your target blood pressure.  If you are between 73-70 years old, ask your health care provider if you should take aspirin to prevent heart disease.  Have regular diabetes screenings by checking your fasting blood sugar level. ? If you are at a normal weight and have a low risk for diabetes, have this test once every three years after the age of 92. ? If you are overweight and have a high risk for diabetes, consider being tested at a younger age or more often.  A one-time screening for abdominal aortic aneurysm (AAA) by ultrasound is recommended for men aged 28-75 years who are current or former smokers. What should I know about preventing infection? Hepatitis B If you have a higher risk for hepatitis B, you should be screened for this virus. Talk with your health care provider to find out if you are at risk for hepatitis B infection. Hepatitis C Blood testing is recommended for:  Everyone born from [birth year] through 1967.  Anyone with known risk factors for hepatitis C. Sexually Transmitted Diseases (STDs)  You should be screened each year for STDs including gonorrhea and chlamydia if: ? You are sexually active and are younger than 65 years of age. ? You are older than 65 years of age and your health  care provider tells you that you are at risk for this type of infection. ? Your sexual activity has changed since you were last screened and you are at an increased risk for chlamydia  or gonorrhea. Ask your health care provider if you are at risk.  Talk with your health care provider about whether you are at high risk of being infected with HIV. Your health care provider may recommend a prescription medicine to help prevent HIV infection. What else can I do?  Schedule regular health, dental, and eye exams.  Stay current with your vaccines (immunizations).  Do not use any tobacco products, such as cigarettes, chewing tobacco, and e-cigarettes. If you need help quitting, ask your health care provider.  Limit alcohol intake to no more than 2 drinks per day. One drink equals 12 ounces of beer, 5 ounces of wine, or 1 ounces of hard liquor.  Do not use street drugs.  Do not share needles.  Ask your health care provider for help if you need support or information about quitting drugs.  Tell your health care provider if you often feel depressed.  Tell your health care provider if you have ever been abused or do not feel safe at home. This information is not intended to replace advice given to you by your health care provider. Make sure you discuss any questions you have with your health care provider. Document Released: 12/18/2007 Document Revised: 02/18/2016 Document Reviewed: 03/25/2015 Elsevier Interactive Patient Education  2019 [employer].

## 2018-07-31 NOTE — Progress Notes (Signed)
Jorge Barker 63 y.o.   Chief Complaint  Patient presents with  . Annual Exam  . Medication Refill    ambien and tadalafil    HISTORY OF PRESENT ILLNESS: This is a 63 y.o. male here for his annual exam.  No complaints or medical concerns today. Has a history of hypertension, on Avapro 150 mg a day. Has a history of insomnia and occasionally takes Ambien. Has a history of ED takes tadalafil as needed. HPI   Prior to Admission medications   Medication Sig Start Date End Date Taking? Authorizing Provider  acyclovir (ZOVIRAX) 400 MG tablet Take 400 mg by mouth 3 (three) times daily. 04/05/17  Yes [provider]  azelastine (ASTELIN) 0.1 % nasal spray Place 2 sprays into both nostrils 2 (two) times daily. Use in each nostril as directed 06/21/17  Yes Wardell Honour, MD  irbesartan (AVAPRO) 150 MG tablet Take 150 mg by mouth daily.   Yes [provider]  tadalafil (CIALIS) 20 MG tablet TAKE 1/2-1 TAB BY MOUTH EVERY DAY AS NEEDED 06/21/17  Yes Wardell Honour, MD  aspirin EC 81 MG tablet Take 1 tablet (81 mg total) by mouth daily. Patient not taking: Reported on 07/31/2018 04/09/15   Tereasa Coop, PA-C  zolpidem (AMBIEN) 10 MG tablet TAKE 1 TABLET (10 MG TOTAL) BY MOUTH AT BEDTIME AS NEEDED FOR SLEEP. 01/21/18 04/21/18  Wardell Honour, MD    Allergies  Allergen Reactions  . Citalopram Hydrobromide     Felt hung-over  . Influenza Vaccines     Serum sickness like illness  . Oxycodone-Acetaminophen     REACTION: severe nausea and vomiting  . Percocet [Oxycodone-Acetaminophen]     Patient Active Problem List   Diagnosis Date Noted  . BMI 29.0-29.9,adult 06/21/2017  . Sleep apnea with use of continuous positive airway pressure (CPAP) 06/04/2013  . Hypersomnia with sleep apnea, unspecified 06/04/2013  . Erectile dysfunction   . Snoring   . HBP (high blood pressure) 02/03/2012  . Renal insufficiency 02/03/2012  . Insomnia 02/03/2012    Past Medical  History:  Diagnosis Date  . Allergy   . Arthritis    RIGHT shoulder OA  . Chronic renal insufficiency, stage 2 (mild)    [name] every six months.  . Erectile dysfunction   . Hypertension   . Insomnia   . Sleep apnea with use of continuous positive airway pressure (CPAP) 06/04/2013  . Snoring     No past surgical history on file.  Social History   Socioeconomic History  . Marital status: Married    Spouse name: [name]  . Number of children: 2  . Years of education: Assoc.  . Highest education level: Not on file  Occupational History  . Occupation: drive bus part time    Employer: RETIRED  Social Needs  . Financial resource strain: Not on file  . Food insecurity:    Worry: Not on file    Inability: Not on file  . Transportation needs:    Medical: Not on file    Non-medical: Not on file  Tobacco Use  . Smoking status: Former Smoker    Years: 12.00    Types: Cigarettes    Last attempt to quit: 07/05/1981    Years since quitting: 37.0  . Smokeless tobacco: Never Used  Substance and Sexual Activity  . Alcohol use: Yes    Comment: drinks beer and spirit (RUM)/10oz weekly  . Drug use: No  . Sexual activity:  Yes    Birth control/protection: Post-menopausal  Lifestyle  . Physical activity:    Days per week: Not on file    Minutes per session: Not on file  . Stress: Not on file  Relationships  . Social connections:    Talks on phone: Not on file    Gets together: Not on file    Attends religious service: Not on file    Active member of club or organization: Not on file    Attends meetings of clubs or organizations: Not on file    Relationship status: Not on file  . Intimate partner violence:    Fear of current or ex partner: Not on file    Emotionally abused: Not on file    Physically abused: Not on file    Forced sexual activity: Not on file  Other Topics Concern  . Not on file  Social History Narrative   Patient is married ([name]) and lives at home with his  wife.  Married x 1977.   Patient has two adult children.  Four grandchildren.      Lives: with wife.   Married. Education: [employer]. Exercise: Walk daily 30-60 minutes. Patient is right handed and Consumes 1-2 cups of caffiene daily.      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       Tobacco; quit 35 years ago; smoked for 9-30 yo.      Alcohol: 3-7drinks per week       Exercise:  None in 2018; active in yard; yard is 1 acre.                     Family History  Problem Relation Age of Onset  . Cancer Mother        intestinal cancer/abdomen     Review of Systems  Constitutional: Negative.  Negative for chills, fever and weight loss.  HENT: Negative.  Negative for congestion, nosebleeds and sore throat.   Eyes: Negative.  Negative for blurred vision and double vision.  Respiratory: Negative.  Negative for cough, hemoptysis, shortness of breath and wheezing.   Cardiovascular: Negative.  Negative for chest pain and palpitations.  Gastrointestinal: Negative.  Negative for abdominal pain, diarrhea, nausea and vomiting.  Genitourinary: Negative.  Negative for dysuria and hematuria.  Musculoskeletal: Negative.  Negative for back pain, myalgias and neck pain.  Skin: Negative.  Negative for rash.  Neurological: Negative.  Negative for dizziness and headaches.  Endo/Heme/Allergies: Negative.   All other systems reviewed and are negative.   Vitals:   07/31/18 1324  BP: (!) 168/83  Pulse: 74  Resp: 16  Temp: 98.4 F (36.9 C)  SpO2: 97%    Physical Exam Vitals signs reviewed.  Constitutional:      Appearance: Normal appearance.  HENT:     Head: Normocephalic and atraumatic.     Nose: Nose normal.     Mouth/Throat:     Mouth: Mucous membranes are moist.     Pharynx: Oropharynx is clear.  Eyes:     Extraocular Movements: Extraocular movements intact.      Conjunctiva/sclera: Conjunctivae normal.     Pupils: Pupils are equal, round, and reactive to light.  Neck:     Musculoskeletal: Normal range of motion.     Vascular: No carotid bruit.  Cardiovascular:     Rate and  Rhythm: Normal rate and regular rhythm.     Heart sounds: Normal heart sounds.  Pulmonary:     Effort: Pulmonary effort is normal.     Breath sounds: Normal breath sounds.  Abdominal:     General: There is no distension.     Palpations: Abdomen is soft. There is no mass.     Tenderness: There is no abdominal tenderness.  Musculoskeletal: Normal range of motion.  Lymphadenopathy:     Cervical: No cervical adenopathy.  Skin:    General: Skin is warm and dry.     Capillary Refill: Capillary refill takes less than 2 seconds.  Neurological:     General: No focal deficit present.     Mental Status: He is alert and oriented to person, place, and time.  Psychiatric:        Mood and Affect: Mood normal.        Behavior: Behavior normal.      ASSESSMENT & PLAN: Othmar was seen today for annual exam and medication refill.  Diagnoses and all orders for this visit:  Routine general medical examination at a health care facility  Renal insufficiency -     CBC with Differential/Platelet -     Comprehensive metabolic panel -     Hemoglobin A1c  Essential hypertension -     CBC with Differential/Platelet -     Comprehensive metabolic panel -     Lipid panel -     TSH -     Hemoglobin A1c  Sleep apnea with use of continuous positive airway pressure (CPAP)  Adjustment insomnia -     zolpidem (AMBIEN) 10 MG tablet; Take 1 tablet (10 mg total) by mouth at bedtime as needed for sleep.  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 dysfunction -     tadalafil (CIALIS) 20 MG tablet; TAKE 1/2-1 TAB BY MOUTH EVERY DAY AS NEEDED     Patient Instructions       If you have lab work done today you will be contacted with your lab results  within the next 2 weeks.  If you have not heard from [place] then please contact us. The fastest way to get your results is to register for My Chart.   IF you received an x-ray today, you will receive an invoice from [facility]. Please contact [facility] at [phone] with questions or concerns regarding your invoice.   IF you received labwork today, you will receive an invoice from [facility]. Please contact [facility] at [phone] with questions or concerns regarding your invoice.   Our billing staff will not be able to assist you with questions regarding bills from these companies.  You will be contacted with the lab results as soon as they are available. The fastest way to get your results is to activate your My Chart account. Instructions are located on the last page of this paperwork. If you have not heard from [place] regarding the results in 2 weeks, please contact this office.     Hypertension Hypertension, commonly called high blood pressure, is when the force of blood pumping through the arteries is too strong. The arteries are the blood vessels that carry blood from the heart throughout the body. Hypertension forces the heart to work harder to pump blood and may cause arteries to become narrow or stiff. Having untreated or uncontrolled hypertension can cause heart attacks, strokes, kidney disease, and other problems. A blood pressure reading consists of a higher number over a lower number.  Ideally, your blood pressure should be below 120/80. The first ("top") number is called the systolic pressure. It is a measure of the pressure in your arteries as your heart beats. The second ("bottom") number is called the diastolic pressure. It is a measure of the pressure in your arteries as the heart relaxes. What are the causes? The cause of this condition is not known. What increases the risk? Some risk factors for high blood pressure are under your control. Others are not. Factors  you can change  Smoking.  Having type 2 diabetes mellitus, high cholesterol, or both.  Not getting enough exercise or physical activity.  Being overweight.  Having too much fat, sugar, calories, or salt (sodium) in your diet.  Drinking too much alcohol. Factors that are difficult or impossible to change  Having chronic kidney disease.  Having a family history of high blood pressure.  Age. Risk increases with age.  Race. You may be at higher risk if you are African-American.  Gender. Men are at higher risk than women before age 34. After age 45, women are at higher risk than men.  Having obstructive sleep apnea.  Stress. What are the signs or symptoms? Extremely high blood pressure (hypertensive crisis) may cause:  Headache.  Anxiety.  Shortness of breath.  Nosebleed.  Nausea and vomiting.  Severe chest pain.  Jerky movements you cannot control (seizures). How is this diagnosed? This condition is diagnosed by measuring your blood pressure while you are seated, with your arm resting on a surface. The cuff of the blood pressure monitor will be placed directly against the skin of your upper arm at the level of your heart. It should be measured at least twice using the same arm. Certain conditions can cause a difference in blood pressure between your right and left arms. Certain factors can cause blood pressure readings to be lower or higher than normal (elevated) for a short period of time:  When your blood pressure is higher when you are in a health care provider's office than when you are at home, this is called white coat hypertension. Most people with this condition do not need medicines.  When your blood pressure is higher at home than when you are in a health care provider's office, this is called masked hypertension. Most people with this condition may need medicines to control blood pressure. If you have a high blood pressure reading during one visit or you have  normal blood pressure with other risk factors:  You may be asked to return on a different day to have your blood pressure checked again.  You may be asked to monitor your blood pressure at home for 1 week or longer. If you are diagnosed with hypertension, you may have other blood or imaging tests to help your health care provider understand your overall risk for other conditions. How is this treated? This condition is treated by making healthy lifestyle changes, such as eating healthy foods, exercising more, and reducing your alcohol intake. Your health care provider may prescribe medicine if lifestyle changes are not enough to get your blood pressure under control, and if:  Your systolic blood pressure is above 130.  Your diastolic blood pressure is above 80. Your personal target blood pressure may vary depending on your medical conditions, your age, and other factors. Follow these instructions at home: Eating and drinking   Eat a diet that is high in fiber and potassium, and low in sodium, added sugar, and fat. An example  eating plan is called the DASH (Dietary Approaches to Stop Hypertension) diet. To eat this way: ? Eat plenty of fresh fruits and vegetables. Try to fill half of your plate at each meal with fruits and vegetables. ? Eat whole grains, such as whole wheat pasta, brown rice, or whole grain bread. Fill about one quarter of your plate with whole grains. ? Eat or drink low-fat dairy products, such as skim milk or low-fat yogurt. ? Avoid fatty cuts of meat, processed or cured meats, and poultry with skin. Fill about one quarter of your plate with lean proteins, such as fish, chicken without skin, beans, eggs, and tofu. ? Avoid premade and processed foods. These tend to be higher in sodium, added sugar, and fat.  Reduce your daily sodium intake. Most people with hypertension should eat less than 1,500 mg of sodium a day.  Limit alcohol intake to no more than 1 drink a day for  nonpregnant women and 2 drinks a day for men. One drink equals 12 oz of beer, 5 oz of wine, or 1 oz of hard liquor. Lifestyle   Work with your health care provider to maintain a healthy body weight or to lose weight. Ask what an ideal weight is for you.  Get at least 30 minutes of exercise that causes your heart to beat faster (aerobic exercise) most days of the week. Activities may include walking, swimming, or biking.  Include exercise to strengthen your muscles (resistance exercise), such as pilates or lifting weights, as part of your weekly exercise routine. Try to do these types of exercises for 30 minutes at least 3 days a week.  Do not use any products that contain nicotine or tobacco, such as cigarettes and e-cigarettes. If you need help quitting, ask your health care provider.  Monitor your blood pressure at home as told by your health care provider.  Keep all follow-up visits as told by your health care provider. This is important. Medicines  Take over-the-counter and prescription medicines only as told by your health care provider. Follow directions carefully. Blood pressure medicines must be taken as prescribed.  Do not skip doses of blood pressure medicine. Doing this puts you at risk for problems and can make the medicine less effective.  Ask your health care provider about side effects or reactions to medicines that you should watch for. Contact a health care provider if:  You think you are having a reaction to a medicine you are taking.  You have headaches that keep coming back (recurring).  You feel dizzy.  You have swelling in your ankles.  You have trouble with your vision. Get help right away if:  You develop a severe headache or confusion.  You have unusual weakness or numbness.  You feel faint.  You have severe pain in your chest or abdomen.  You vomit repeatedly.  You have trouble breathing. Summary  Hypertension is when the force of blood  pumping through your arteries is too strong. If this condition is not controlled, it may put you at risk for serious complications.  Your personal target blood pressure may vary depending on your medical conditions, your age, and other factors. For most people, a normal blood pressure is less than 120/80.  Hypertension is treated with lifestyle changes, medicines, or a combination of both. Lifestyle changes include weight loss, eating a healthy, low-sodium diet, exercising more, and limiting alcohol. This information is not intended to replace advice given to you by your health care provider.  Make sure you discuss any questions you have with your health care provider. Document Released: 06/21/2005 Document Revised: 05/19/2016 Document Reviewed: 05/19/2016 Elsevier Interactive Patient Education  2019 [place] Maintenance, Male A healthy lifestyle and preventive care is important for your health and wellness. Ask your health care provider about what schedule of regular examinations is right for you. What should I know about weight and diet? Eat a Healthy Diet  Eat plenty of vegetables, fruits, whole grains, low-fat dairy products, and lean protein.  Do not eat a lot of foods high in solid fats, added sugars, or salt.  Maintain a Healthy Weight Regular exercise can help you achieve or maintain a healthy weight. You should:  Do at least 150 minutes of exercise each week. The exercise should increase your heart rate and make you sweat (moderate-intensity exercise).  Do strength-training exercises at least twice a week. Watch Your Levels of Cholesterol and Blood Lipids  Have your blood tested for lipids and cholesterol every 5 years starting at 66 years of age. If you are at high risk for heart disease, you should start having your blood tested when you are 65 years old. You may need to have your cholesterol levels checked more often if: ? Your lipid or cholesterol levels are  high. ? You are older than 65 years of age. ? You are at high risk for heart disease. What should I know about cancer screening? Many types of cancers can be detected early and may often be prevented. Lung Cancer  You should be screened every year for lung cancer if: ? You are a current smoker who has smoked for at least 30 years. ? You are a former smoker who has quit within the past 15 years.  Talk to your health care provider about your screening options, when you should start screening, and how often you should be screened. Colorectal Cancer  Routine colorectal cancer screening usually begins at 65 years of age and should be repeated every 5-10 years until you are 65 years old. You may need to be screened more often if early forms of precancerous polyps or small growths are found. Your health care provider may recommend screening at an earlier age if you have risk factors for colon cancer.  Your health care provider may recommend using home test kits to check for hidden blood in the stool.  A small camera at the end of a tube can be used to examine your colon (sigmoidoscopy or colonoscopy). This checks for the earliest forms of colorectal cancer. Prostate and Testicular Cancer  Depending on your age and overall health, your health care provider may do certain tests to screen for prostate and testicular cancer.  Talk to your health care provider about any symptoms or concerns you have about testicular or prostate cancer. Skin Cancer  Check your skin from head to toe regularly.  Tell your health care provider about any new moles or changes in moles, especially if: ? There is a change in a mole's size, shape, or color. ? You have a mole that is larger than a pencil eraser.  Always use sunscreen. Apply sunscreen liberally and repeat throughout the day.  Protect yourself by wearing long sleeves, pants, a wide-brimmed hat, and sunglasses when outside. What should I know about heart  disease, diabetes, and high blood pressure?  If you are 25-47 years of age, have your blood pressure checked every 3-5 years. If you are 43 years of age or older,  have your blood pressure checked every year. You should have your blood pressure measured twice-once when you are at a hospital or clinic, and once when you are not at a hospital or clinic. Record the average of the two measurements. To check your blood pressure when you are not at a hospital or clinic, you can use: ? An automated blood pressure machine at a pharmacy. ? A home blood pressure monitor.  Talk to your health care provider about your target blood pressure.  If you are between 25-60 years old, ask your health care provider if you should take aspirin to prevent heart disease.  Have regular diabetes screenings by checking your fasting blood sugar level. ? If you are at a normal weight and have a low risk for diabetes, have this test once every three years after the age of 40. ? If you are overweight and have a high risk for diabetes, consider being tested at a younger age or more often.  A one-time screening for abdominal aortic aneurysm (AAA) by ultrasound is recommended for men aged 69-75 years who are current or former smokers. What should I know about preventing infection? Hepatitis B If you have a higher risk for hepatitis B, you should be screened for this virus. Talk with your health care provider to find out if you are at risk for hepatitis B infection. Hepatitis C Blood testing is recommended for:  Everyone born from [birth year] through 1967.  Anyone with known risk factors for hepatitis C. Sexually Transmitted Diseases (STDs)  You should be screened each year for STDs including gonorrhea and chlamydia if: ? You are sexually active and are younger than 65 years of age. ? You are older than 65 years of age and your health care provider tells you that you are at risk for this type of infection. ? Your sexual activity  has changed since you were last screened and you are at an increased risk for chlamydia or gonorrhea. Ask your health care provider if you are at risk.  Talk with your health care provider about whether you are at high risk of being infected with HIV. Your health care provider may recommend a prescription medicine to help prevent HIV infection. What else can I do?  Schedule regular health, dental, and eye exams.  Stay current with your vaccines (immunizations).  Do not use any tobacco products, such as cigarettes, chewing tobacco, and e-cigarettes. If you need help quitting, ask your health care provider.  Limit alcohol intake to no more than 2 drinks per day. One drink equals 12 ounces of beer, 5 ounces of wine, or 1 ounces of hard liquor.  Do not use street drugs.  Do not share needles.  Ask your health care provider for help if you need support or information about quitting drugs.  Tell your health care provider if you often feel depressed.  Tell your health care provider if you have ever been abused or do not feel safe at home. This information is not intended to replace advice given to you by your health care provider. Make sure you discuss any questions you have with your health care provider. Document Released: 12/18/2007 Document Revised: 02/18/2016 Document Reviewed: 03/25/2015 Elsevier Interactive Patient Education  2019 [employer] Inc[employer]      Agustina Caroli, MD [facility]

## 2018-08-01 LAB — COMPREHENSIVE METABOLIC PANEL
A/G RATIO: 2 (ref 1.2–2.2)
ALT: 23 IU/L (ref 0–44)
AST: 24 IU/L (ref 0–40)
Albumin: 4.3 g/dL (ref 3.8–4.8)
Alkaline Phosphatase: 57 IU/L (ref 39–117)
BILIRUBIN TOTAL: 0.7 mg/dL (ref 0.0–1.2)
BUN/Creatinine Ratio: 10 (ref 10–24)
BUN: 14 mg/dL (ref 8–27)
CO2: 23 mmol/L (ref 20–29)
Calcium: 9.2 mg/dL (ref 8.6–10.2)
Chloride: 100 mmol/L (ref 96–106)
Creatinine, Ser: 1.44 mg/dL — ABNORMAL HIGH (ref 0.76–1.27)
GFR calc Af Amer: 59 mL/min/{1.73_m2} — ABNORMAL LOW (ref >59)
GFR calc non Af Amer: 51 mL/min/{1.73_m2} — ABNORMAL LOW (ref >59)
GLUCOSE: 89 mg/dL (ref 65–99)
Globulin, Total: 2.1 g/dL (ref 1.5–4.5)
POTASSIUM: 4.5 mmol/L (ref 3.5–5.2)
Sodium: 138 mmol/L (ref 134–144)
TOTAL PROTEIN: 6.4 g/dL (ref 6.0–8.5)

## 2018-08-01 LAB — CBC WITH DIFFERENTIAL/PLATELET
BASOS: 1 %
Basophils Absolute: 0.1 10*3/uL (ref 0.0–0.2)
EOS (ABSOLUTE): 0.1 10*3/uL (ref 0.0–0.4)
EOS: 1 %
HEMOGLOBIN: 15 g/dL (ref 13.0–17.7)
Hematocrit: 45.7 % (ref 37.5–51.0)
IMMATURE GRANS (ABS): 0.1 10*3/uL (ref 0.0–0.1)
Immature Granulocytes: 1 %
LYMPHS: 22 %
Lymphocytes Absolute: 2.1 10*3/uL (ref 0.7–3.1)
MCH: 29.2 pg (ref 26.6–33.0)
MCHC: 32.8 g/dL (ref 31.5–35.7)
MCV: 89 fL (ref 79–97)
MONOCYTES: 9 %
Monocytes Absolute: 0.8 10*3/uL (ref 0.1–0.9)
NEUTROS ABS: 6.6 10*3/uL (ref 1.4–7.0)
Neutrophils: 66 %
Platelets: 272 10*3/uL (ref 150–450)
RBC: 5.14 x10E6/uL (ref 4.14–5.80)
RDW: 12.9 % (ref 11.6–15.4)
WBC: 9.7 10*3/uL (ref 3.4–10.8)

## 2018-08-01 LAB — LIPID PANEL
CHOLESTEROL TOTAL: 178 mg/dL (ref 100–199)
Chol/HDL Ratio: 3.7 ratio (ref 0.0–5.0)
HDL: 48 mg/dL (ref >39)
LDL Calculated: 96 mg/dL (ref 0–99)
TRIGLYCERIDES: 168 mg/dL — AB (ref 0–149)
VLDL Cholesterol Cal: 34 mg/dL (ref 5–40)

## 2018-08-01 LAB — TSH: TSH: 1.58 u[IU]/mL (ref 0.450–4.500)

## 2018-08-01 LAB — HEMOGLOBIN A1C
Est. average glucose Bld gHb Est-mCnc: 111 mg/dL
Hgb A1c MFr Bld: 5.5 % (ref 4.8–5.6)

## 2018-08-02 ENCOUNTER — Encounter: Payer: Self-pay | Admitting: Radiology

## 2019-07-12 ENCOUNTER — Ambulatory Visit (INDEPENDENT_AMBULATORY_CARE_PROVIDER_SITE_OTHER): Payer: PPO | Admitting: Emergency Medicine

## 2019-07-12 ENCOUNTER — Encounter: Payer: Self-pay | Admitting: Emergency Medicine

## 2019-07-12 ENCOUNTER — Other Ambulatory Visit: Payer: Self-pay

## 2019-07-12 VITALS — BP 160/94 | HR 69 | Temp 97.9°F | Resp 16 | Ht 73.0 in | Wt 233.0 lb

## 2019-07-12 DIAGNOSIS — Z1211 Encounter for screening for malignant neoplasm of colon: Secondary | ICD-10-CM | POA: Diagnosis not present

## 2019-07-12 DIAGNOSIS — G4730 Sleep apnea, unspecified: Secondary | ICD-10-CM | POA: Diagnosis not present

## 2019-07-12 DIAGNOSIS — Z130 Encounter for screening for diseases of the blood and blood-forming organs and certain disorders involving the immune mechanism: Secondary | ICD-10-CM | POA: Diagnosis not present

## 2019-07-12 DIAGNOSIS — Z1329 Encounter for screening for other suspected endocrine disorder: Secondary | ICD-10-CM | POA: Diagnosis not present

## 2019-07-12 DIAGNOSIS — Z13220 Encounter for screening for lipoid disorders: Secondary | ICD-10-CM

## 2019-07-12 DIAGNOSIS — I10 Essential (primary) hypertension: Secondary | ICD-10-CM | POA: Diagnosis not present

## 2019-07-12 DIAGNOSIS — Z13228 Encounter for screening for other metabolic disorders: Secondary | ICD-10-CM | POA: Diagnosis not present

## 2019-07-12 DIAGNOSIS — F5102 Adjustment insomnia: Secondary | ICD-10-CM | POA: Diagnosis not present

## 2019-07-12 DIAGNOSIS — Z23 Encounter for immunization: Secondary | ICD-10-CM | POA: Diagnosis not present

## 2019-07-12 DIAGNOSIS — N5203 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 dysfunction: Secondary | ICD-10-CM

## 2019-07-12 DIAGNOSIS — Z125 Encounter for screening for malignant neoplasm of prostate: Secondary | ICD-10-CM | POA: Diagnosis not present

## 2019-07-12 DIAGNOSIS — Z0001 Encounter for general adult medical examination with abnormal findings: Secondary | ICD-10-CM | POA: Diagnosis not present

## 2019-07-12 DIAGNOSIS — J0100 Acute maxillary sinusitis, unspecified: Secondary | ICD-10-CM | POA: Diagnosis not present

## 2019-07-12 DIAGNOSIS — N289 Disorder of kidney and ureter, unspecified: Secondary | ICD-10-CM | POA: Diagnosis not present

## 2019-07-12 DIAGNOSIS — Z0000 Encounter for general adult medical examination without abnormal findings: Secondary | ICD-10-CM

## 2019-07-12 MED ORDER — AZITHROMYCIN 250 MG PO TABS: ORAL_TABLET | ORAL | 0 refills | Status: DC

## 2019-07-12 MED ORDER — TADALAFIL 20 MG PO TABS: ORAL_TABLET | ORAL | 11 refills | Status: DC

## 2019-07-12 MED ORDER — ZOLPIDEM TARTRATE 10 MG PO TABS: 10.0000 mg | ORAL_TABLET | Freq: Every evening | ORAL | 5 refills | Status: DC | PRN

## 2019-07-12 NOTE — Patient Instructions (Addendum)
   If you have lab work done today you will be contacted with your lab results within the next 2 weeks.  If you have not heard from us then please contact us. The fastest way to get your results is to register for My Chart.   IF you received an x-ray today, you will receive an invoice from [facility]. Please contact [facility] at [phone] with questions or concerns regarding your invoice.   IF you received labwork today, you will receive an invoice from [facility]. Please contact [facility] at [phone] with questions or concerns regarding your invoice.   Our billing staff will not be able to assist you with questions regarding bills from these companies.  You will be contacted with the lab results as soon as they are available. The fastest way to get your results is to activate your My Chart account. Instructions are located on the last page of this paperwork. If you have not heard from us regarding the results in 2 weeks, please contact this office.      Health Maintenance, Male Adopting a healthy lifestyle and getting preventive care are important in promoting health and wellness. Ask your health care provider about:  The right schedule for you to have regular tests and exams.  Things you can do on your own to prevent diseases and keep yourself healthy. What should I know about diet, weight, and exercise? Eat a healthy diet   Eat a diet that includes plenty of vegetables, fruits, low-fat dairy products, and lean protein.  Do not eat a lot of foods that are high in solid fats, added sugars, or sodium. Maintain a healthy weight Body mass index (BMI) is a measurement that can be used to identify possible weight problems. It estimates body fat based on height and weight. Your health care provider can help determine your BMI and help you achieve or maintain a healthy weight. Get regular exercise Get regular exercise. This is one of the most important things you  can do for your health. Most adults should:  Exercise for at least 150 minutes each week. The exercise should increase your heart rate and make you sweat (moderate-intensity exercise).  Do strengthening exercises at least twice a week. This is in addition to the moderate-intensity exercise.  Spend less time sitting. Even light physical activity can be beneficial. Watch cholesterol and blood lipids Have your blood tested for lipids and cholesterol at 66 years of age, then have this test every 5 years. You may need to have your cholesterol levels checked more often if:  Your lipid or cholesterol levels are high.  You are older than 66 years of age.  You are at high risk for heart disease. What should I know about cancer screening? Many types of cancers can be detected early and may often be prevented. Depending on your health history and family history, you may need to have cancer screening at various ages. This may include screening for:  Colorectal cancer.  Prostate cancer.  Skin cancer.  Lung cancer. What should I know about heart disease, diabetes, and high blood pressure? Blood pressure and heart disease  High blood pressure causes heart disease and increases the risk of stroke. This is more likely to develop in people who have high blood pressure readings, are of African descent, or are overweight.  Talk with your health care provider about your target blood pressure readings.  Have your blood pressure checked: ? Every 3-5 years if you are 18-39   years of age. ? Every year if you are 40 years old or older.  If you are between the ages of 65 and 75 and are a current or former smoker, ask your health care provider if you should have a one-time screening for abdominal aortic aneurysm (AAA). Diabetes Have regular diabetes screenings. This checks your fasting blood sugar level. Have the screening done:  Once every three years after age 45 if you are at a normal weight and have  a low risk for diabetes.  More often and at a younger age if you are overweight or have a high risk for diabetes. What should I know about preventing infection? Hepatitis B If you have a higher risk for hepatitis B, you should be screened for this virus. Talk with your health care provider to find out if you are at risk for hepatitis B infection. Hepatitis C Blood testing is recommended for:  Everyone born from 1945 through 1965.  Anyone with known risk factors for hepatitis C. Sexually transmitted infections (STIs)  You should be screened each year for STIs, including gonorrhea and chlamydia, if: ? You are sexually active and are younger than 66 years of age. ? You are older than 66 years of age and your health care provider tells you that you are at risk for this type of infection. ? Your sexual activity has changed since you were last screened, and you are at increased risk for chlamydia or gonorrhea. Ask your health care provider if you are at risk.  Ask your health care provider about whether you are at high risk for HIV. Your health care provider may recommend a prescription medicine to help prevent HIV infection. If you choose to take medicine to prevent HIV, you should first get tested for HIV. You should then be tested every 3 months for as long as you are taking the medicine. Follow these instructions at home: Lifestyle  Do not use any products that contain nicotine or tobacco, such as cigarettes, e-cigarettes, and chewing tobacco. If you need help quitting, ask your health care provider.  Do not use street drugs.  Do not share needles.  Ask your health care provider for help if you need support or information about quitting drugs. Alcohol use  Do not drink alcohol if your health care provider tells you not to drink.  If you drink alcohol: ? Limit how much you have to 0-2 drinks a day. ? Be aware of how much alcohol is in your drink. In the [place] one drink equals one 12  oz bottle of beer (355 mL), one 5 oz glass of wine (148 mL), or one 1 oz glass of hard liquor (44 mL). General instructions  Schedule regular health, dental, and eye exams.  Stay current with your vaccines.  Tell your health care provider if: ? You often feel depressed. ? You have ever been abused or do not feel safe at home. Summary  Adopting a healthy lifestyle and getting preventive care are important in promoting health and wellness.  Follow your health care provider's instructions about healthy diet, exercising, and getting tested or screened for diseases.  Follow your health care provider's instructions on monitoring your cholesterol and blood pressure. This information is not intended to replace advice given to you by your health care provider. Make sure you discuss any questions you have with your health care provider. Document Revised: 06/14/2018 Document Reviewed: 06/14/2018 Elsevier Patient Education  2020 Elsevier Inc.  

## 2019-07-12 NOTE — Progress Notes (Signed)
Jorge Barker 64 y.o.   No chief complaint on file.   HISTORY OF PRESENT ILLNESS: This is a 64 y.o. male here for his annual exam. 1.  Hypertension: On irbesartan 150 mg daily.  Blood pressure readings at home within normal limits. 2.  OSA on CPAP treatment.  Doing well. 3.  Chronic sinus issues: Purulent discharge the last couple days. 4.  Chronic insomnia: Takes Ambien as needed. 5.  Erectile dysfunction: Using Cialis with good results. 6.  Mild renal insufficiency: Recently seen by nephrologist and cleared.  Doing well.  No complaints. No other complaints or medical concerns today. Colonoscopy 5 years ago showed polyps.  Needs follow-up.  HPI   Prior to Admission medications   Medication Sig Start Date End Date Taking? Authorizing Provider  acyclovir (ZOVIRAX) 400 MG tablet Take 400 mg by mouth 3 (three) times daily. 04/05/17   [provider]  aspirin EC 81 MG tablet Take 1 tablet (81 mg total) by mouth daily. Patient not taking: Reported on 07/31/2018 04/09/15   Tereasa Coop, PA-C  azelastine (ASTELIN) 0.1 % nasal spray Place 2 sprays into both nostrils 2 (two) times daily. Use in each nostril as directed 06/21/17   Wardell Honour, MD  irbesartan (AVAPRO) 150 MG tablet Take 150 mg by mouth daily.    [provider]  tadalafil (CIALIS) 20 MG tablet TAKE 1/2-1 TAB BY MOUTH EVERY DAY AS NEEDED 07/31/18   Horald Pollen, MD  zolpidem (AMBIEN) 10 MG tablet Take 1 tablet (10 mg total) by mouth at bedtime as needed for sleep. 07/31/18 10/29/18  Horald Pollen, MD    Allergies  Allergen Reactions  . Citalopram Hydrobromide     Felt hung-over  . Influenza Vaccines     Serum sickness like illness  . Oxycodone-Acetaminophen     REACTION: severe nausea and vomiting  . Percocet [Oxycodone-Acetaminophen]     Patient Active Problem List   Diagnosis Date Noted  . BMI 29.0-29.9,adult 06/21/2017  . Sleep apnea with use of continuous positive airway  pressure (CPAP) 06/04/2013  . Hypersomnia with sleep apnea, unspecified 06/04/2013  . Erectile dysfunction   . Snoring   . HBP (high blood pressure) 02/03/2012  . Renal insufficiency 02/03/2012  . Insomnia 02/03/2012    Past Medical History:  Diagnosis Date  . Allergy   . Arthritis    RIGHT shoulder OA  . Chronic renal insufficiency, stage 2 (mild)    [name] every six months.  . Erectile dysfunction   . Hypertension   . Insomnia   . Sleep apnea with use of continuous positive airway pressure (CPAP) 06/04/2013  . Snoring     History reviewed. No pertinent surgical history.  Social History   Socioeconomic History  . Marital status: Married    Spouse name: [name]  . Number of children: 2  . Years of education: Assoc.  . Highest education level: Not on file  Occupational History  . Occupation: drive bus part time    Employer: RETIRED  Tobacco Use  . Smoking status: Former Smoker    Years: 12.00    Types: Cigarettes    Quit date: 07/05/1981    Years since quitting: 38.0  . Smokeless tobacco: Never Used  Substance and Sexual Activity  . Alcohol use: Yes    Comment: drinks beer and spirit (RUM)/10oz weekly  . Drug use: No  . Sexual activity: Yes    Birth control/protection: Post-menopausal  Other Topics Concern  .  Not on file  Social History Narrative   Patient is married ([name]) and lives at home with his wife.  Married x 1977.   Patient has two adult children.  Four grandchildren.      Lives: with wife.   Married. Education: [employer]. Exercise: Walk daily 30-60 minutes. Patient is right handed and Consumes 1-2 cups of caffiene daily.      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       Tobacco; quit 35 years ago; smoked for 97-30 yo.      Alcohol: 3-7drinks per week       Exercise:  None in 2018; active in yard; yard is 1 acre.                     Social Determinants of Health   Financial Resource Strain:   . Difficulty of Paying Living Expenses: Not on file  Food Insecurity:   . Worried About Charity fundraiser in the Last Year: Not on file  . Ran Out of Food in the Last Year: Not on file  Transportation Needs:   . Lack of Transportation (Medical): Not on file  . Lack of Transportation (Non-Medical): Not on file  Physical Activity:   . Days of Exercise per Week: Not on file  . Minutes of Exercise per Session: Not on file  Stress:   . Feeling of Stress : Not on file  Social Connections:   . Frequency of Communication with Friends and Family: Not on file  . Frequency of Social Gatherings with Friends and Family: Not on file  . Attends Religious Services: Not on file  . Active Member of Clubs or Organizations: Not on file  . Attends Archivist Meetings: Not on file  . Marital Status: Not on file  Intimate Partner Violence:   . Fear of Current or Ex-Partner: Not on file  . Emotionally Abused: Not on file  . Physically Abused: Not on file  . Sexually Abused: Not on file    Family History  Problem Relation Age of Onset  . Cancer Mother        intestinal cancer/abdomen     Review of Systems  Constitutional: Negative.  Negative for chills and fever.  HENT: Positive for sinus pain (Chronic sinus issues). Negative for congestion and sore throat.   Eyes: Negative.   Respiratory: Negative.  Negative for cough and shortness of breath.   Cardiovascular: Negative.  Negative for chest pain and palpitations.  Gastrointestinal: Negative.  Negative for abdominal pain, diarrhea, nausea and vomiting.  Genitourinary: Negative.  Negative for dysuria and hematuria.  Musculoskeletal: Negative.  Negative for myalgias and neck pain.  Skin: Negative.  Negative for rash.  Neurological: Negative.  Negative for dizziness and headaches.  Endo/Heme/Allergies: Negative.   All other systems reviewed and are  negative.  Today's Vitals   07/12/19 0809  BP: (!) 160/94  Pulse: 69  Resp: 16  Temp: 97.9 F (36.6 C)  TempSrc: Oral  SpO2: 98%  Weight: 233 lb (105.7 kg)  Height: 6\' 1"  (1.854 m)   Body mass index is 30.74 kg/m.   Physical Exam Vitals reviewed.  Constitutional:      Appearance: Normal appearance.  HENT:     Head: Normocephalic.  Eyes:     Extraocular Movements: Extraocular movements intact.  Conjunctiva/sclera: Conjunctivae normal.     Pupils: Pupils are equal, round, and reactive to light.  Neck:     Vascular: No carotid bruit.  Cardiovascular:     Rate and Rhythm: Normal rate and regular rhythm.     Pulses: Normal pulses.     Heart sounds: Normal heart sounds.  Pulmonary:     Effort: Pulmonary effort is normal.     Breath sounds: Normal breath sounds.  Abdominal:     General: There is no distension.     Palpations: Abdomen is soft. There is no mass.     Tenderness: There is no abdominal tenderness. There is no guarding.  Musculoskeletal:        General: Normal range of motion.     Cervical back: Normal range of motion and neck supple.  Lymphadenopathy:     Cervical: No cervical adenopathy.  Skin:    General: Skin is warm and dry.     Capillary Refill: Capillary refill takes less than 2 seconds.  Neurological:     General: No focal deficit present.     Mental Status: He is alert and oriented to person, place, and time.  Psychiatric:        Mood and Affect: Mood normal.        Behavior: Behavior normal.      ASSESSMENT & PLAN: Derrin was seen today for annual exam and medication refill.  Diagnoses and all orders for this visit:  Routine general medical examination at a health care facility  Renal insufficiency -     CBC with Differential  Essential hypertension -     Comprehensive metabolic panel  Sleep apnea with use of continuous positive airway pressure (CPAP)  Adjustment insomnia -     zolpidem (AMBIEN) 10 MG tablet; Take 1 tablet (10  mg total) by mouth at bedtime as needed for sleep.  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 dysfunction -     tadalafil (CIALIS) 20 MG tablet; TAKE 1/2-1 TAB BY MOUTH EVERY DAY AS NEEDED  Prostate cancer screening -     PSA(Must document that pt has been informed of limitations of PSA testing.)  Colon cancer screening -     Ambulatory referral to Gastroenterology  Screening for deficiency anemia -     CBC with Differential  Screening for lipoid disorders -     Lipid panel  Screening for endocrine, metabolic and immunity disorder  Need for vaccination with 13-polyvalent pneumococcal conjugate vaccine -     Prevnar 13  Acute non-recurrent maxillary sinusitis -     azithromycin (ZITHROMAX) 250 MG tablet; Sig as indicated    Patient Instructions       If you have lab work done today you will be contacted with your lab results within the next 2 weeks.  If you have not heard from [place] then please contact us. The fastest way to get your results is to register for My Chart.   IF you received an x-ray today, you will receive an invoice from [facility]. Please contact [facility] at [phone] with questions or concerns regarding your invoice.   IF you received labwork today, you will receive an invoice from [facility]. Please contact [facility] at [phone] with questions or concerns regarding your invoice.   Our billing staff will not be able to assist you with questions regarding bills from these companies.  You will be contacted with the lab results as soon as they are available. The fastest way to  get your results is to activate your My Chart account. Instructions are located on the last page of this paperwork. If you have not heard from [place] regarding the results in 2 weeks, please contact this office.      Health Maintenance, Male Adopting a healthy lifestyle and getting preventive care are important in promoting health and  wellness. Ask your health care provider about:  The right schedule for you to have regular tests and exams.  Things you can do on your own to prevent diseases and keep yourself healthy. What should I know about diet, weight, and exercise? Eat a healthy diet   Eat a diet that includes plenty of vegetables, fruits, low-fat dairy products, and lean protein.  Do not eat a lot of foods that are high in solid fats, added sugars, or sodium. Maintain a healthy weight Body mass index (BMI) is a measurement that can be used to identify possible weight problems. It estimates body fat based on height and weight. Your health care provider can help determine your BMI and help you achieve or maintain a healthy weight. Get regular exercise Get regular exercise. This is one of the most important things you can do for your health. Most adults should:  Exercise for at least 150 minutes each week. The exercise should increase your heart rate and make you sweat (moderate-intensity exercise).  Do strengthening exercises at least twice a week. This is in addition to the moderate-intensity exercise.  Spend less time sitting. Even light physical activity can be beneficial. Watch cholesterol and blood lipids Have your blood tested for lipids and cholesterol at 64 years of age, then have this test every 5 years. You may need to have your cholesterol levels checked more often if:  Your lipid or cholesterol levels are high.  You are older than 66 years of age.  You are at high risk for heart disease. What should I know about cancer screening? Many types of cancers can be detected early and may often be prevented. Depending on your health history and family history, you may need to have cancer screening at various ages. This may include screening for:  Colorectal cancer.  Prostate cancer.  Skin cancer.  Lung cancer. What should I know about heart disease, diabetes, and high blood pressure? Blood pressure  and heart disease  High blood pressure causes heart disease and increases the risk of stroke. This is more likely to develop in people who have high blood pressure readings, are of African descent, or are overweight.  Talk with your health care provider about your target blood pressure readings.  Have your blood pressure checked: ? Every 3-5 years if you are 43-13 years of age. ? Every year if you are 27 years old or older.  If you are between the ages of 43 and 41 and are a current or former smoker, ask your health care provider if you should have a one-time screening for abdominal aortic aneurysm (AAA). Diabetes Have regular diabetes screenings. This checks your fasting blood sugar level. Have the screening done:  Once every three years after age 81 if you are at a normal weight and have a low risk for diabetes.  More often and at a younger age if you are overweight or have a high risk for diabetes. What should I know about preventing infection? Hepatitis B If you have a higher risk for hepatitis B, you should be screened for this virus. Talk with your health care provider to find out  if you are at risk for hepatitis B infection. Hepatitis C Blood testing is recommended for:  Everyone born from [birth year] through 1965.  Anyone with known risk factors for hepatitis C. Sexually transmitted infections (STIs)  You should be screened each year for STIs, including gonorrhea and chlamydia, if: ? You are sexually active and are younger than 66 years of age. ? You are older than 66 years of age and your health care provider tells you that you are at risk for this type of infection. ? Your sexual activity has changed since you were last screened, and you are at increased risk for chlamydia or gonorrhea. Ask your health care provider if you are at risk.  Ask your health care provider about whether you are at high risk for HIV. Your health care provider may recommend a prescription medicine to help  prevent HIV infection. If you choose to take medicine to prevent HIV, you should first get tested for HIV. You should then be tested every 3 months for as long as you are taking the medicine. Follow these instructions at home: Lifestyle  Do not use any products that contain nicotine or tobacco, such as cigarettes, e-cigarettes, and chewing tobacco. If you need help quitting, ask your health care provider.  Do not use street drugs.  Do not share needles.  Ask your health care provider for help if you need support or information about quitting drugs. Alcohol use  Do not drink alcohol if your health care provider tells you not to drink.  If you drink alcohol: ? Limit how much you have to 0-2 drinks a day. ? Be aware of how much alcohol is in your drink. In the [place] one drink equals one 12 oz bottle of beer (355 mL), one 5 oz glass of wine (148 mL), or one 1 oz glass of hard liquor (44 mL). General instructions  Schedule regular health, dental, and eye exams.  Stay current with your vaccines.  Tell your health care provider if: ? You often feel depressed. ? You have ever been abused or do not feel safe at home. Summary  Adopting a healthy lifestyle and getting preventive care are important in promoting health and wellness.  Follow your health care provider's instructions about healthy diet, exercising, and getting tested or screened for diseases.  Follow your health care provider's instructions on monitoring your cholesterol and blood pressure. This information is not intended to replace advice given to you by your health care provider. Make sure you discuss any questions you have with your health care provider. Document Revised: 06/14/2018 Document Reviewed: 06/14/2018 Elsevier Patient Education  2020 Elsevier Inc.      Agustina Caroli, MD [facility]

## 2019-07-13 LAB — COMPREHENSIVE METABOLIC PANEL
ALT: 17 IU/L (ref 0–44)
AST: 21 IU/L (ref 0–40)
Albumin/Globulin Ratio: 1.8 (ref 1.2–2.2)
Albumin: 4.4 g/dL (ref 3.8–4.8)
Alkaline Phosphatase: 66 IU/L (ref 39–117)
BUN/Creatinine Ratio: 13 (ref 10–24)
BUN: 19 mg/dL (ref 8–27)
Bilirubin Total: 0.9 mg/dL (ref 0.0–1.2)
CO2: 23 mmol/L (ref 20–29)
Calcium: 9 mg/dL (ref 8.6–10.2)
Chloride: 102 mmol/L (ref 96–106)
Creatinine, Ser: 1.45 mg/dL — ABNORMAL HIGH (ref 0.76–1.27)
GFR calc Af Amer: 58 mL/min/{1.73_m2} — ABNORMAL LOW (ref >59)
GFR calc non Af Amer: 50 mL/min/{1.73_m2} — ABNORMAL LOW (ref >59)
Globulin, Total: 2.4 g/dL (ref 1.5–4.5)
Glucose: 110 mg/dL — ABNORMAL HIGH (ref 65–99)
Potassium: 4.2 mmol/L (ref 3.5–5.2)
Sodium: 140 mmol/L (ref 134–144)
Total Protein: 6.8 g/dL (ref 6.0–8.5)

## 2019-07-13 LAB — PSA: Prostate Specific Ag, Serum: 0.9 ng/mL (ref 0.0–4.0)

## 2019-07-13 LAB — LIPID PANEL
Chol/HDL Ratio: 3.7 ratio (ref 0.0–5.0)
Cholesterol, Total: 171 mg/dL (ref 100–199)
HDL: 46 mg/dL (ref >39)
LDL Chol Calc (NIH): 102 mg/dL — ABNORMAL HIGH (ref 0–99)
Triglycerides: 127 mg/dL (ref 0–149)
VLDL Cholesterol Cal: 23 mg/dL (ref 5–40)

## 2019-07-13 LAB — CBC WITH DIFFERENTIAL/PLATELET
Basophils Absolute: 0.1 10*3/uL (ref 0.0–0.2)
Basos: 1 %
EOS (ABSOLUTE): 0.2 10*3/uL (ref 0.0–0.4)
Eos: 2 %
Hematocrit: 47.3 % (ref 37.5–51.0)
Hemoglobin: 15.5 g/dL (ref 13.0–17.7)
Immature Grans (Abs): 0.1 10*3/uL (ref 0.0–0.1)
Immature Granulocytes: 1 %
Lymphocytes Absolute: 2.3 10*3/uL (ref 0.7–3.1)
Lymphs: 22 %
MCH: 30 pg (ref 26.6–33.0)
MCHC: 32.8 g/dL (ref 31.5–35.7)
MCV: 92 fL (ref 79–97)
Monocytes Absolute: 0.9 10*3/uL (ref 0.1–0.9)
Monocytes: 9 %
Neutrophils Absolute: 6.7 10*3/uL (ref 1.4–7.0)
Neutrophils: 65 %
Platelets: 287 10*3/uL (ref 150–450)
RBC: 5.17 x10E6/uL (ref 4.14–5.80)
RDW: 12.3 % (ref 11.6–15.4)
WBC: 10.2 10*3/uL (ref 3.4–10.8)

## 2019-07-15 ENCOUNTER — Encounter: Payer: Self-pay | Admitting: Emergency Medicine

## 2019-07-30 ENCOUNTER — Other Ambulatory Visit: Payer: Self-pay

## 2019-07-30 ENCOUNTER — Ambulatory Visit (AMBULATORY_SURGERY_CENTER): Payer: Self-pay | Admitting: *Deleted

## 2019-07-30 VITALS — Temp 96.9°F | Ht 73.0 in | Wt 235.0 lb

## 2019-07-30 DIAGNOSIS — Z86010 Personal history of colonic polyps: Secondary | ICD-10-CM

## 2019-07-30 DIAGNOSIS — Z01818 Encounter for other preprocedural examination: Secondary | ICD-10-CM

## 2019-07-30 MED ORDER — SUPREP BOWEL PREP KIT 17.5-3.13-1.6 GM/177ML PO SOLN: 1.0000 | Freq: Once | ORAL | 0 refills | Status: AC

## 2019-07-30 NOTE — Progress Notes (Signed)
No egg or soy allergy known to patient  No issues with past sedation with any surgeries  or procedures, no intubation problems  No diet pills per patient No home 02 use per patient  No blood thinners per patient  Pt denies issues with constipation  No A fib or A flutter  EMMI video sent to pt's e mail  HTA- Lot suprep sample [id number] exp 102-/22   Due to the COVID-19 pandemic we are asking patients to follow these guidelines. Please only bring one care partner. Please be aware that your care partner may wait in the car in the parking lot or if they feel like they will be too hot to wait in the car, they may wait in the lobby on the 4th floor. All care partners are required to wear a mask the entire time (we do not have any that we can provide them), they need to practice social distancing, and we will do a Covid check for all patient's and care partners when you arrive. Also we will check their temperature and your temperature. If the care partner waits in their car they need to stay in the parking lot the entire time and we will call them on their cell phone when the patient is ready for discharge so they can bring the car to the front of the building. Also all patient's will need to wear a mask into building.

## 2019-08-07 ENCOUNTER — Encounter: Payer: Self-pay | Admitting: Gastroenterology

## 2019-08-08 ENCOUNTER — Ambulatory Visit (INDEPENDENT_AMBULATORY_CARE_PROVIDER_SITE_OTHER): Payer: PPO

## 2019-08-08 ENCOUNTER — Other Ambulatory Visit: Payer: Self-pay | Admitting: Gastroenterology

## 2019-08-08 DIAGNOSIS — Z1159 Encounter for screening for other viral diseases: Secondary | ICD-10-CM

## 2019-08-08 LAB — SARS CORONAVIRUS 2 (TAT 6-24 HRS): SARS Coronavirus 2: NEGATIVE

## 2019-08-13 ENCOUNTER — Other Ambulatory Visit: Payer: Self-pay

## 2019-08-13 ENCOUNTER — Ambulatory Visit (AMBULATORY_SURGERY_CENTER): Payer: PPO | Admitting: Gastroenterology

## 2019-08-13 ENCOUNTER — Encounter: Payer: Self-pay | Admitting: Gastroenterology

## 2019-08-13 VITALS — BP 144/84 | HR 62 | Temp 96.9°F | Resp 16 | Ht 73.0 in | Wt 235.0 lb

## 2019-08-13 DIAGNOSIS — D123 Benign neoplasm of transverse colon: Secondary | ICD-10-CM

## 2019-08-13 DIAGNOSIS — D125 Benign neoplasm of sigmoid colon: Secondary | ICD-10-CM | POA: Diagnosis not present

## 2019-08-13 DIAGNOSIS — G4733 Obstructive sleep apnea (adult) (pediatric): Secondary | ICD-10-CM | POA: Diagnosis not present

## 2019-08-13 DIAGNOSIS — Z86010 Personal history of colonic polyps: Secondary | ICD-10-CM

## 2019-08-13 DIAGNOSIS — N183 Chronic kidney disease, stage 3 unspecified: Secondary | ICD-10-CM | POA: Diagnosis not present

## 2019-08-13 DIAGNOSIS — I129 Hypertensive chronic kidney disease with stage 1 through stage 4 chronic kidney disease, or unspecified chronic kidney disease: Secondary | ICD-10-CM | POA: Diagnosis not present

## 2019-08-13 MED ORDER — SODIUM CHLORIDE 0.9 % IV SOLN: 500.0000 mL | Freq: Once | INTRAVENOUS | Status: DC

## 2019-08-13 NOTE — Progress Notes (Signed)
Report to PACU, RN, vss, BBS= Clear.  

## 2019-08-13 NOTE — Progress Notes (Signed)
Called to room to assist during endoscopic procedure.  Patient ID and intended procedure confirmed with present staff. Received instructions for my participation in the procedure from the performing physician.  

## 2019-08-13 NOTE — Progress Notes (Signed)
Pt's states no medical or surgical changes since previsit or office visit.  JB temp, AG vitals and SH IV.

## 2019-08-13 NOTE — Patient Instructions (Signed)
3 polyps removed today.  Dr Ardis Hughs will mail you a letter explaining the pathology in about 2 weeks. Hemorrhoids noted.  [employer] everyday (FIBER SUPPLEMENT)    YOU HAD AN ENDOSCOPIC PROCEDURE TODAY AT [facility]:   Refer to the procedure report that was given to you for any specific questions about what was found during the examination.  If the procedure report does not answer your questions, please call your gastroenterologist to clarify.  If you requested that your care partner not be given the details of your procedure findings, then the procedure report has been included in a sealed envelope for you to review at your convenience later.  YOU SHOULD EXPECT: Some feelings of bloating in the abdomen. Passage of more gas than usual.  Walking can help get rid of the air that was put into your GI tract during the procedure and reduce the bloating. If you had a lower endoscopy (such as a colonoscopy or flexible sigmoidoscopy) you may notice spotting of blood in your stool or on the toilet paper. If you underwent a bowel prep for your procedure, you may not have a normal bowel movement for a few days.  Please Note:  You might notice some irritation and congestion in your nose or some drainage.  This is from the oxygen used during your procedure.  There is no need for concern and it should clear up in a day or so.  SYMPTOMS TO REPORT IMMEDIATELY:   Following lower endoscopy (colonoscopy or flexible sigmoidoscopy):  Excessive amounts of blood in the stool  Significant tenderness or worsening of abdominal pains  Swelling of the abdomen that is new, acute  Fever of 100F or higher   For urgent or emergent issues, a gastroenterologist can be reached at any hour by calling [phone].   DIET:  We do recommend a small meal at first, but then you may proceed to your regular diet.  Drink plenty of fluids but you should avoid alcoholic beverages for 24 hours.  ACTIVITY:  You should  plan to take it easy for the rest of today and you should NOT DRIVE or use heavy machinery until tomorrow (because of the sedation medicines used during the test).    FOLLOW UP: Our staff will call the number listed on your records 48-72 hours following your procedure to check on you and address any questions or concerns that you may have regarding the information given to you following your procedure. If we do not reach you, we will leave a message.  We will attempt to reach you two times.  During this call, we will ask if you have developed any symptoms of COVID 19. If you develop any symptoms (ie: fever, flu-like symptoms, shortness of breath, cough etc.) before then, please call [phone].  If you test positive for Covid 19 in the 2 weeks post procedure, please call and report this information to [place].    If any biopsies were taken you will be contacted by phone or by letter within the next 1-3 weeks.  Please call us at [phone] if you have not heard about the biopsies in 3 weeks.    SIGNATURES/CONFIDENTIALITY: You and/or your care partner have signed paperwork which will be entered into your electronic medical record.  These signatures attest to the fact that that the information above on your After Visit Summary has been reviewed and is understood.  Full responsibility of the confidentiality of this discharge information lies with you and/or  your care-partner. 

## 2019-08-13 NOTE — Op Note (Signed)
[name] Procedure Date: 08/13/2019 8:47 AM MRN: [id number] Endoscopist: Milus Banister , MD Age: 64 Referring MD:  Date of Birth: [birth year] Gender: Male Account #: [id number] Procedure:                Colonoscopy Indications:              High risk colon cancer surveillance: Personal                            history of colonic polyps; Colonoscopy 2010 single                            subCM TA Medicines:                Monitored Anesthesia Care Procedure:                Pre-Anesthesia Assessment:                           - Prior to the procedure, a History and Physical                            was performed, and patient medications and                            allergies were reviewed. The patient's tolerance of                            previous anesthesia was also reviewed. The risks                            and benefits of the procedure and the sedation                            options and risks were discussed with the patient.                            All questions were answered, and informed consent                            was obtained. Prior Anticoagulants: The patient has                            taken no previous anticoagulant or antiplatelet                            agents. ASA Grade Assessment: II - A patient with                            mild systemic disease. After reviewing the risks                            and benefits, the patient was deemed in  satisfactory condition to undergo the procedure.                           After obtaining informed consent, the colonoscope                            was passed under direct vision. Throughout the                            procedure, the patient's blood pressure, pulse, and                            oxygen saturations were monitored continuously. The                            Colonoscope was introduced through the anus and                      advanced to the the cecum, identified by                            appendiceal orifice and ileocecal valve. The                            colonoscopy was performed without difficulty. The                            patient tolerated the procedure well. The quality                            of the bowel preparation was good. The ileocecal                            valve, appendiceal orifice, and rectum were                            photographed. Scope In: 8:51:30 AM Scope Out: 9:01:38 AM Scope Withdrawal Time: 0 hours 8 minutes 45 seconds  Total Procedure Duration: 0 hours 10 minutes 8 seconds  Findings:                 Three sessile polyps were found in the sigmoid                            colon and transverse colon. The polyps were 3 to 4                            mm in size. These polyps were removed with a cold                            snare. Resection and retrieval were complete.                           Small internal and external hemorrhoids.  Soft 1cm nodule intimately associated with                            intrernal hemorrhoids. see images.                           The exam was otherwise without abnormality on                            direct and retroflexion views. Complications:            No immediate complications. Estimated blood loss:                            None. Estimated Blood Loss:     Estimated blood loss: none. Impression:               - Three 3 to 4 mm polyps in the sigmoid colon and                            in the transverse colon, removed with a cold snare.                            Resected and retrieved.                           - Soft 1cm nodule intimately associated with                            intrernal hemorrhoids. This might be a polyp but                            given it's location I do not think it is safe to                            remove it via colonoscopy.                            - The examination was otherwise normal on direct                            and retroflexion views. Recommendation:           - Patient has a contact number available for                            emergencies. The signs and symptoms of potential                            delayed complications were discussed with the                            patient. Return to normal activities tomorrow.                            Written  discharge instructions were provided to the                            patient.                           - Resume previous diet.                           - Continue present medications.                           - Await pathology results.                           - My office will arrange referral to [facility] to                            consider transanal removal of the nodule. Milus Banister, MD 08/13/2019 9:06:56 AM This report has been signed electronically.

## 2019-08-14 ENCOUNTER — Telehealth: Payer: Self-pay

## 2019-08-14 NOTE — Telephone Encounter (Signed)
Referral to [facility] has been made per colon 2/8 report

## 2019-08-15 ENCOUNTER — Telehealth: Payer: Self-pay

## 2019-08-15 NOTE — Telephone Encounter (Signed)
  Follow up Call-  Call back number 08/13/2019  Post procedure Call Back phone  # [phone]  Permission to leave phone message Yes  Some recent data might be hidden     Patient questions:  Do you have a fever, pain , or abdominal swelling? No. Pain Score  0 *  Have you tolerated food without any problems? Yes.    Have you been able to return to your normal activities? Yes.    Do you have any questions about your discharge instructions: Diet   No. Medications  No. Follow up visit  No.  Do you have questions or concerns about your Care? No.  Actions: * If pain score is 4 or above: No action needed, pain <4. Have you developed a fever since your procedure? *no 2.   Have you had an respiratory symptoms (SOB or cough) since your procedure? no  3.   Have you tested positive for COVID 19 since your procedure no  4.   Have you had any family members/close contacts diagnosed with the COVID 19 since your procedure?  no   If yes to any of these questions please route to Joylene John, RN and Alphonsa Gin, Therapist, sports.

## 2019-08-21 ENCOUNTER — Encounter: Payer: Self-pay | Admitting: Gastroenterology

## 2019-08-28 ENCOUNTER — Other Ambulatory Visit: Payer: Self-pay | Admitting: Surgery

## 2019-08-28 DIAGNOSIS — K620 Anal polyp: Secondary | ICD-10-CM | POA: Diagnosis not present

## 2019-09-11 ENCOUNTER — Encounter (HOSPITAL_BASED_OUTPATIENT_CLINIC_OR_DEPARTMENT_OTHER): Payer: Self-pay | Admitting: Surgery

## 2019-09-17 ENCOUNTER — Other Ambulatory Visit (HOSPITAL_COMMUNITY)
Admission: RE | Admit: 2019-09-17 | Discharge: 2019-09-17 | Disposition: A | Discharge status: Home / Self Care | Payer: PPO | Source: Ambulatory Visit | Attending: Surgery | Admitting: Surgery

## 2019-09-17 ENCOUNTER — Other Ambulatory Visit: Payer: Self-pay

## 2019-09-17 ENCOUNTER — Encounter (HOSPITAL_BASED_OUTPATIENT_CLINIC_OR_DEPARTMENT_OTHER)
Admission: RE | Admit: 2019-09-17 | Discharge: 2019-09-17 | Disposition: A | Discharge status: Home / Self Care | Payer: PPO | Source: Ambulatory Visit | Attending: Surgery | Admitting: Surgery

## 2019-09-17 DIAGNOSIS — Z01818 Encounter for other preprocedural examination: Secondary | ICD-10-CM | POA: Diagnosis not present

## 2019-09-17 DIAGNOSIS — Z20822 Contact with and (suspected) exposure to covid-19: Secondary | ICD-10-CM | POA: Diagnosis not present

## 2019-09-17 LAB — SARS CORONAVIRUS 2 (TAT 6-24 HRS): SARS Coronavirus 2: NEGATIVE

## 2019-09-17 LAB — BASIC METABOLIC PANEL
Anion gap: 8 (ref 5–15)
BUN: 12 mg/dL (ref 8–23)
CO2: 26 mmol/L (ref 22–32)
Calcium: 8.9 mg/dL (ref 8.9–10.3)
Chloride: 106 mmol/L (ref 98–111)
Creatinine, Ser: 1.51 mg/dL — ABNORMAL HIGH (ref 0.61–1.24)
GFR calc Af Amer: 55 mL/min — ABNORMAL LOW (ref >60)
GFR calc non Af Amer: 48 mL/min — ABNORMAL LOW (ref >60)
Glucose, Bld: 125 mg/dL — ABNORMAL HIGH (ref 70–99)
Potassium: 4.7 mmol/L (ref 3.5–5.1)
Sodium: 140 mmol/L (ref 135–145)

## 2019-09-17 NOTE — Progress Notes (Signed)
Lab work  reviewed by Dr. Royce Macadamia and will proceed with surgery as scheduled.

## 2019-09-17 NOTE — Progress Notes (Signed)

## 2019-09-18 NOTE — H&P (Signed)
Renea Ee Documented: 08/28/2019 10:17 AM Location: [facility] Patient #: [id number] DOB: [birth year] Married / Language: Cleophus Molt / Race: White Male   History of Present Illness Vanwhy A. Ninfa Linden MD; 08/28/2019 10:52 AM) The patient is a 65 year old male who presents with a complaint of anal problems. Chief complaint: Anal polyp  This is a 64 year old gentleman referred by Dr. Oretha Caprice for evaluation of an anal polyp. The patient had undergone a surveillance colonoscopy for history of polyps. He has several benign polyps removed from the sigmoid colon. He was also found to have a 1 cm anal polyp near hemorrhoid. It was not an area that could safely removed with the colonoscope. He reports he has had problems with hemorrhoids in the past. He has some mild discomfort and occasional bleeding. He has no issues with incontinence. He is otherwise healthy and without complaints.   Past Surgical History Malachi Bonds, CMA; 08/28/2019 10:17 AM) Colon Polyp Removal - Colonoscopy  Knee Surgery  Left.  Diagnostic Studies History Malachi Bonds, CMA; 08/28/2019 10:17 AM) Colonoscopy  within last year  Allergies Malachi Bonds, CMA; 08/28/2019 10:17 AM) Percocet *ANALGESICS - OPIOID*   Medication History Malachi Bonds, CMA; 08/28/2019 10:18 AM) Zolpidem Tartrate (10MG  Tablet, Oral) Active. Irbesartan (150MG  Tablet, Oral) Active. Tadalafil (20MG  Tablet, Oral) Active. Medications Reconciled  Social History Malachi Bonds, CMA; 08/28/2019 10:17 AM) Alcohol use  Moderate alcohol use. Caffeine use  Coffee. No drug use  Tobacco use  Former smoker.  Family History Malachi Bonds, CMA; 08/28/2019 10:17 AM) Alcohol Abuse  Father. Arthritis  Daughter.  Other Problems Malachi Bonds, CMA; 08/28/2019 10:17 AM) Arthritis  High blood pressure  Sleep Apnea     Review of Systems (Chemira Jones CMA; 08/28/2019 10:17 AM) General Not Present- Appetite Loss,  Chills, Fatigue, Fever, Night Sweats, Weight Gain and Weight Loss. Skin Not Present- Change in Wart/Mole, Dryness, Hives, Jaundice, New Lesions, Non-Healing Wounds, Rash and Ulcer. HEENT Present- Seasonal Allergies and Wears glasses/contact lenses. Not Present- Earache, Hearing Loss, Hoarseness, Nose Bleed, Oral Ulcers, Ringing in the Ears, Sinus Pain, Sore Throat, Visual Disturbances and Yellow Eyes. Respiratory Not Present- Bloody sputum, Chronic Cough, Difficulty Breathing, Snoring and Wheezing. Breast Not Present- Breast Mass, Breast Pain, Nipple Discharge and Skin Changes. Cardiovascular Not Present- Chest Pain, Difficulty Breathing Lying Down, Leg Cramps, Palpitations, Rapid Heart Rate, Shortness of Breath and Swelling of Extremities. Gastrointestinal Present- Hemorrhoids. Not Present- Abdominal Pain, Bloating, Bloody Stool, Change in Bowel Habits, Chronic diarrhea, Constipation, Difficulty Swallowing, Excessive gas, Gets full quickly at meals, Indigestion, Nausea, Rectal Pain and Vomiting. Male Genitourinary Not Present- Blood in Urine, Change in Urinary Stream, Frequency, Impotence, Nocturia, Painful Urination, Urgency and Urine Leakage. Musculoskeletal Not Present- Back Pain, Joint Pain, Joint Stiffness, Muscle Pain, Muscle Weakness and Swelling of Extremities. Neurological Not Present- Decreased Memory, Fainting, Headaches, Numbness, Seizures, Tingling, Tremor, Trouble walking and Weakness. Psychiatric Not Present- Anxiety, Bipolar, Change in Sleep Pattern, Depression, Fearful and Frequent crying. Endocrine Present- Hair Changes. Not Present- Cold Intolerance, Excessive Hunger, Heat Intolerance, Hot flashes and New Diabetes. Hematology Not Present- Blood Thinners, Easy Bruising, Excessive bleeding, Gland problems, HIV and Persistent Infections.  Vitals (Chemira Jones CMA; 08/28/2019 10:17 AM) 08/28/2019 10:17 AM Weight: 236.4 lb Height: 74in Body Surface Area: 2.33 m Body Mass Index:  30.35 kg/m  Temp.: 96.85F  Pulse: 92 (Regular)  BP: 180/90 (Sitting, Left Arm, Standard)       Physical Exam (Venne A. Ninfa Linden MD; 08/28/2019 10:53 AM) The physical  exam findings are as follows: Note:On exam, he has some excoriated hemorrhoidal tissue externally. I performed a digital examination could feel a small polyp. I attempted an anoscopy but could not visualize the polyp secondary to his discomfort from the external hemorrhoids.    Assessment & Plan (Sweetman A. Ninfa Linden MD; 08/28/2019 10:54 AM) ANAL POLYP (K62.0) Impression: This is a patient with an anal polyp. I discussed the diagnosis with him in detail. Surgical excision of the polyp is recommended in the operating room for complete removal and to rule out malignancy. We discussed his hemorrhoids as well and he does not want hemorrhoidectomy but just was removal of the polyp. I discussed the surgical procedure with him in detail. We discussed the risk of bleeding, infection, the need for further procedures if malignancy is found, postoperative recovery, cardiopulmonary issues, etc. At this point, he agrees to proceed with an examination under anesthesia and excision of the anal polyp.  This patient encounter took 45 minutes today to perform the following: take history, perform exam, review outside records, interpret imaging, counsel the patient on their diagnosis and document encounter, findings & plan in the EHR

## 2019-09-19 ENCOUNTER — Ambulatory Visit (HOSPITAL_BASED_OUTPATIENT_CLINIC_OR_DEPARTMENT_OTHER)
Admission: RE | Admit: 2019-09-19 | Discharge: 2019-09-19 | Disposition: A | Discharge status: Home / Self Care | Payer: PPO | Attending: Surgery | Admitting: Surgery

## 2019-09-19 ENCOUNTER — Encounter (HOSPITAL_BASED_OUTPATIENT_CLINIC_OR_DEPARTMENT_OTHER)
Admission: RE | Disposition: A | Discharge status: Home / Self Care | Payer: Self-pay | Source: Home / Self Care | Attending: Surgery

## 2019-09-19 ENCOUNTER — Encounter (HOSPITAL_BASED_OUTPATIENT_CLINIC_OR_DEPARTMENT_OTHER): Payer: Self-pay | Admitting: Surgery

## 2019-09-19 ENCOUNTER — Other Ambulatory Visit: Payer: Self-pay

## 2019-09-19 ENCOUNTER — Ambulatory Visit (HOSPITAL_BASED_OUTPATIENT_CLINIC_OR_DEPARTMENT_OTHER): Payer: PPO | Admitting: Anesthesiology

## 2019-09-19 DIAGNOSIS — Z79899 Other long term (current) drug therapy: Secondary | ICD-10-CM | POA: Diagnosis not present

## 2019-09-19 DIAGNOSIS — K620 Anal polyp: Secondary | ICD-10-CM | POA: Diagnosis not present

## 2019-09-19 DIAGNOSIS — K644 Residual hemorrhoidal skin tags: Secondary | ICD-10-CM | POA: Diagnosis not present

## 2019-09-19 DIAGNOSIS — N529 Male erectile dysfunction, unspecified: Secondary | ICD-10-CM | POA: Diagnosis not present

## 2019-09-19 DIAGNOSIS — M1990 Unspecified osteoarthritis, unspecified site: Secondary | ICD-10-CM | POA: Diagnosis not present

## 2019-09-19 DIAGNOSIS — Z8261 Family history of arthritis: Secondary | ICD-10-CM | POA: Diagnosis not present

## 2019-09-19 DIAGNOSIS — Z86010 Personal history of colonic polyps: Secondary | ICD-10-CM | POA: Insufficient documentation

## 2019-09-19 DIAGNOSIS — Z885 Allergy status to narcotic agent status: Secondary | ICD-10-CM | POA: Insufficient documentation

## 2019-09-19 DIAGNOSIS — G4730 Sleep apnea, unspecified: Secondary | ICD-10-CM | POA: Diagnosis not present

## 2019-09-19 DIAGNOSIS — Z87891 Personal history of nicotine dependence: Secondary | ICD-10-CM | POA: Insufficient documentation

## 2019-09-19 DIAGNOSIS — Z811 Family history of alcohol abuse and dependence: Secondary | ICD-10-CM | POA: Insufficient documentation

## 2019-09-19 DIAGNOSIS — I10 Essential (primary) hypertension: Secondary | ICD-10-CM | POA: Insufficient documentation

## 2019-09-19 HISTORY — PX: HEMORRHOID SURGERY: SHX153

## 2019-09-19 SURGERY — EXAM UNDER ANESTHESIA
Anesthesia: General | Site: Anus

## 2019-09-19 MED ORDER — TRAMADOL HCL 50 MG PO TABS: 50.0000 mg | ORAL_TABLET | Freq: Four times a day (QID) | ORAL | 0 refills | Status: DC | PRN

## 2019-09-19 MED ORDER — CHLORHEXIDINE GLUCONATE CLOTH 2 % EX PADS: 6.0000 | MEDICATED_PAD | Freq: Once | CUTANEOUS | Status: DC

## 2019-09-19 MED ORDER — DEXAMETHASONE SODIUM PHOSPHATE 4 MG/ML IJ SOLN
INTRAMUSCULAR | Status: DC | PRN
  Administered 2019-09-19: 5 mg via INTRAVENOUS

## 2019-09-19 MED ORDER — BUPIVACAINE HCL (PF) 0.5 % IJ SOLN
INTRAMUSCULAR | Status: AC
  Filled 2019-09-19: qty 30

## 2019-09-19 MED ORDER — CEFAZOLIN SODIUM-DEXTROSE 2-4 GM/100ML-% IV SOLN
INTRAVENOUS | Status: AC
  Filled 2019-09-19: qty 100

## 2019-09-19 MED ORDER — CEFAZOLIN SODIUM-DEXTROSE 2-4 GM/100ML-% IV SOLN: 2.0000 g | INTRAVENOUS | Status: DC

## 2019-09-19 MED ORDER — MIDAZOLAM HCL 2 MG/2ML IJ SOLN: 1.0000 mg | INTRAMUSCULAR | Status: DC | PRN

## 2019-09-19 MED ORDER — ACETAMINOPHEN 500 MG PO TABS
1000.0000 mg | ORAL_TABLET | ORAL | Status: AC
  Administered 2019-09-19: 1000 mg via ORAL

## 2019-09-19 MED ORDER — FENTANYL CITRATE (PF) 100 MCG/2ML IJ SOLN
50.0000 ug | INTRAMUSCULAR | Status: DC | PRN
  Administered 2019-09-19 (×2): 50 ug via INTRAVENOUS

## 2019-09-19 MED ORDER — LACTATED RINGERS IV SOLN: INTRAVENOUS | Status: DC

## 2019-09-19 MED ORDER — ACETAMINOPHEN 500 MG PO TABS
ORAL_TABLET | ORAL | Status: AC
  Filled 2019-09-19: qty 2

## 2019-09-19 MED ORDER — ONDANSETRON HCL 4 MG/2ML IJ SOLN
INTRAMUSCULAR | Status: DC | PRN
  Administered 2019-09-19: 4 mg via INTRAVENOUS

## 2019-09-19 MED ORDER — GABAPENTIN 300 MG PO CAPS
ORAL_CAPSULE | ORAL | Status: AC
  Filled 2019-09-19: qty 1

## 2019-09-19 MED ORDER — LIDOCAINE HCL (CARDIAC) PF 100 MG/5ML IV SOSY
PREFILLED_SYRINGE | INTRAVENOUS | Status: DC | PRN
  Administered 2019-09-19: 60 mg via INTRAVENOUS

## 2019-09-19 MED ORDER — PROPOFOL 10 MG/ML IV BOLUS
INTRAVENOUS | Status: DC | PRN
  Administered 2019-09-19: 200 mg via INTRAVENOUS

## 2019-09-19 MED ORDER — CEFAZOLIN SODIUM-DEXTROSE 2-3 GM-%(50ML) IV SOLR
INTRAVENOUS | Status: DC | PRN
  Administered 2019-09-19: 2 g via INTRAVENOUS

## 2019-09-19 MED ORDER — FENTANYL CITRATE (PF) 100 MCG/2ML IJ SOLN: 25.0000 ug | INTRAMUSCULAR | Status: DC | PRN

## 2019-09-19 MED ORDER — BUPIVACAINE LIPOSOME 1.3 % IJ SUSP
INTRAMUSCULAR | Status: DC | PRN
  Administered 2019-09-19: 20 mL

## 2019-09-19 MED ORDER — PROPOFOL 10 MG/ML IV BOLUS
INTRAVENOUS | Status: AC
  Filled 2019-09-19: qty 20

## 2019-09-19 MED ORDER — BUPIVACAINE HCL (PF) 0.5 % IJ SOLN
INTRAMUSCULAR | Status: DC | PRN
  Administered 2019-09-19: 30 mL

## 2019-09-19 MED ORDER — ENSURE PRE-SURGERY PO LIQD: 296.0000 mL | Freq: Once | ORAL | Status: DC

## 2019-09-19 MED ORDER — ONDANSETRON HCL 4 MG/2ML IJ SOLN: 4.0000 mg | Freq: Once | INTRAMUSCULAR | Status: DC | PRN

## 2019-09-19 MED ORDER — GABAPENTIN 300 MG PO CAPS
300.0000 mg | ORAL_CAPSULE | ORAL | Status: AC
  Administered 2019-09-19: 12:00:00 300 mg via ORAL

## 2019-09-19 MED ORDER — LIDOCAINE 5 % EX OINT: 1.0000 "application " | TOPICAL_OINTMENT | CUTANEOUS | 0 refills | Status: DC | PRN

## 2019-09-19 MED ORDER — FENTANYL CITRATE (PF) 100 MCG/2ML IJ SOLN
INTRAMUSCULAR | Status: AC
  Filled 2019-09-19: qty 2

## 2019-09-19 MED ORDER — BUPIVACAINE LIPOSOME 1.3 % IJ SUSP
INTRAMUSCULAR | Status: AC
  Filled 2019-09-19: qty 20

## 2019-09-19 SURGICAL SUPPLY — 38 items
BLADE SURG 15 STRL LF DISP TIS (BLADE) ×1 IMPLANT
BLADE SURG 15 STRL SS (BLADE) ×3
BRIEF STRETCH FOR OB PAD XXL (UNDERPADS AND DIAPERS) ×2 IMPLANT
CANISTER SUCT 1200ML W/VALVE (MISCELLANEOUS) ×3 IMPLANT
COVER MAYO STAND STRL (DRAPES) ×2 IMPLANT
COVER WAND RF STERILE (DRAPES) IMPLANT
DECANTER SPIKE VIAL GLASS SM (MISCELLANEOUS) ×1 IMPLANT
DRAPE UTILITY XL STRL (DRAPES) ×3 IMPLANT
DRSG PAD ABDOMINAL 8X10 ST (GAUZE/BANDAGES/DRESSINGS) ×3 IMPLANT
ELECT REM PT RETURN 9FT ADLT (ELECTROSURGICAL) ×3
ELECTRODE REM PT RTRN 9FT ADLT (ELECTROSURGICAL) ×1 IMPLANT
GAUZE SPONGE 4X4 12PLY STRL LF (GAUZE/BANDAGES/DRESSINGS) ×3 IMPLANT
GLOVE SURG SIGNA 7.5 PF LTX (GLOVE) ×3 IMPLANT
GOWN STRL REUS W/ TWL LRG LVL3 (GOWN DISPOSABLE) ×1 IMPLANT
GOWN STRL REUS W/ TWL XL LVL3 (GOWN DISPOSABLE) ×1 IMPLANT
GOWN STRL REUS W/TWL LRG LVL3 (GOWN DISPOSABLE) ×3
GOWN STRL REUS W/TWL XL LVL3 (GOWN DISPOSABLE) ×3
NDL HYPO 25X1 1.5 SAFETY (NEEDLE) ×1 IMPLANT
NEEDLE HYPO 25X1 1.5 SAFETY (NEEDLE) ×3 IMPLANT
PACK BASIN DAY SURGERY FS (CUSTOM PROCEDURE TRAY) ×3 IMPLANT
PACK LITHOTOMY IV (CUSTOM PROCEDURE TRAY) ×3 IMPLANT
PENCIL SMOKE EVACUATOR (MISCELLANEOUS) ×3 IMPLANT
SHEARS HARMONIC 9CM CVD (BLADE) ×2 IMPLANT
SHEET MEDIUM DRAPE 40X70 STRL (DRAPES) ×3 IMPLANT
SLEEVE SCD COMPRESS KNEE MED (MISCELLANEOUS) ×3 IMPLANT
SPONGE SURGIFOAM ABS GEL 100 (HEMOSTASIS) ×2 IMPLANT
SPONGE SURGIFOAM ABS GEL 12-7 (HEMOSTASIS) IMPLANT
SURGILUBE 2OZ TUBE FLIPTOP (MISCELLANEOUS) ×3 IMPLANT
SUT CHROMIC 2 0 SH (SUTURE) ×4 IMPLANT
SUT CHROMIC 3 0 SH 27 (SUTURE) IMPLANT
SYR CONTROL 10ML LL (SYRINGE) ×3 IMPLANT
TOWEL GREEN STERILE FF (TOWEL DISPOSABLE) ×6 IMPLANT
TRAY DSU PREP LF (CUSTOM PROCEDURE TRAY) ×3 IMPLANT
TRAY PROCTOSCOPIC FIBER OPTIC (SET/KITS/TRAYS/PACK) IMPLANT
TUBE CONNECTING 20'X1/4 (TUBING) ×1
TUBE CONNECTING 20X1/4 (TUBING) ×2 IMPLANT
UNDERPAD 30X36 HEAVY ABSORB (UNDERPADS AND DIAPERS) ×3 IMPLANT
YANKAUER SUCT BULB TIP NO VENT (SUCTIONS) ×3 IMPLANT

## 2019-09-19 NOTE — Anesthesia Procedure Notes (Signed)
Procedure Name: LMA Insertion Date/Time: 09/19/2019 12:43 PM Performed by: Gwyndolyn Saxon, CRNA Pre-anesthesia Checklist: Patient identified, Emergency Drugs available, Suction available and Patient being monitored Patient Re-evaluated:Patient Re-evaluated prior to induction Oxygen Delivery Method: Circle system utilized Preoxygenation: Pre-oxygenation with 100% oxygen Induction Type: IV induction Ventilation: Mask ventilation without difficulty LMA: LMA inserted LMA Size: 4.0 Number of attempts: 1 Placement Confirmation: positive ETCO2 and breath sounds checked- equal and bilateral Tube secured with: Tape Dental Injury: Teeth and Oropharynx as per pre-operative assessment

## 2019-09-19 NOTE — Anesthesia Postprocedure Evaluation (Signed)
Anesthesia Post Note  Patient: Jorge Barker  Procedure(s) Performed: Jasmine December UNDER ANESTHESIA (N/A Anus) EXCISION OF ANAL POLYP (N/A Anus)     Patient location during evaluation: PACU Anesthesia Type: General Level of consciousness: awake and alert and oriented Pain management: pain level controlled Vital Signs Assessment: post-procedure vital signs reviewed and stable Respiratory status: spontaneous breathing, nonlabored ventilation and respiratory function stable Cardiovascular status: blood pressure returned to baseline and stable Postop Assessment: no apparent nausea or vomiting Anesthetic complications: no    Last Vitals:  Vitals:   09/19/19 1345 09/19/19 1400  BP: 123/71 (!) 149/92  Pulse: 60 72  Resp: 19 18  Temp:  36.7 C  SpO2: 98% 94%    Last Pain:  Vitals:   09/19/19 1400  TempSrc:   PainSc: 0-No pain                 Rufus Beske A.

## 2019-09-19 NOTE — Discharge Instructions (Signed)
[facility] _______[facility]  RECTAL SURGERY POST OP INSTRUCTIONS: POST OP INSTRUCTIONS  Always review your discharge instruction sheet given to you by the facility where your surgery was performed. IF YOU HAVE DISABILITY OR FAMILY LEAVE FORMS, YOU MUST BRING THEM TO THE OFFICE FOR PROCESSING.   DO NOT GIVE THEM TO YOUR DOCTOR.  1. A  prescription for pain medication may be given to you upon discharge.  Take your pain medication as prescribed, if needed.  If narcotic pain medicine is not needed, then you may take acetaminophen (Tylenol) or ibuprofen (Advil) as needed. 2. Take your usually prescribed medications unless otherwise directed. 3. If you need a refill on your pain medication, please contact your pharmacy.  They will contact our office to request authorization. Prescriptions will not be filled after 5 pm or on week-ends. 4. You should follow a light diet the first 48 hours after arrival home, such as soup and crackers, etc.  Be sure to include lots of fluids daily.  Resume your normal diet 2-3 days after surgery.. 5. Most patients will experience some swelling and discomfort in the rectal area. Ice packs, reclining and warm tub soaks will help.  Swelling and discomfort can take several days to resolve.  6. It is common to experience some constipation if taking pain medication after surgery.  Increasing fluid intake and taking a stool softener (such as Colace) will usually help or prevent this problem from occurring.  A mild laxative (Milk of Magnesia or Miralax) should be taken according to package directions if there are no bowel movements after 48 hours. 7. Unless discharge instructions indicate otherwise, leave your bandage dry and in place for 24 hours, or remove the bandage if you have a bowel movement. You may notice a small amount of bleeding with bowel movements for the first few days. You may have some packing in the rectum which will come out over the first day or two. You  will need to wear an absorbent pad or soft cotton gauze in your underwear until the drainage stops.it. 8. ACTIVITIES:  You may resume regular (light) daily activities beginning the next day--such as daily self-care, walking, climbing stairs--gradually increasing activities as tolerated.  You may have sexual intercourse when it is comfortable.  Refrain from any heavy lifting or straining until approved by your doctor. a. You may drive when you are no longer taking prescription pain medication, you can comfortably wear a seatbelt, and you can safely maneuver your car and apply brakes. b. RETURN TO WORK: : ____________________ c.  9. You should see your doctor in the office for a follow-up appointment approximately 2-3 weeks after your surgery.  Make sure that you call for this appointment within a day or two after you arrive home to insure a convenient appointment time. 10. OTHER INSTRUCTIONS: SITZ BATHS STARTING TOMORROW DAILY IF POSSIBLE 11. EXPECT BLEEDING 12. IBUPROFEN AND TYLENOL ALSO FOR PAIN __________________________________________________________________________________________________________________________________________________________________________________________  WHEN TO CALL YOUR DOCTOR: 1. Fever over 101.0 2. Inability to urinate 3. Nausea and/or vomiting 4. Extreme swelling or bruising 5. Continued bleeding from rectum. 6. Increased pain, redness, or drainage from the incision 7. Constipation  NO TYLENOL PRODUCTS UNTIL 5:30 PM   The clinic staff is available to answer your questions during regular business hours.  Please don't hesitate to call and ask to speak to one of the nurses for clinical concerns.  If you have a medical emergency, go to the nearest emergency room or call 911.  A  surgeon from [facility] is always on call at the hospital   [address], [place], [place], [place]  [address] ?  [address], [place], [place]   [id number]     Post  Anesthesia Home Care Instructions  Activity: Get plenty of rest for the remainder of the day. A responsible individual must stay with you for 24 hours following the procedure.  For the next 24 hours, DO NOT: -Drive a car -Paediatric nurse -Drink alcoholic beverages -Take any medication unless instructed by your physician -Make any legal decisions or sign important papers.  Meals: Start with liquid foods such as gelatin or soup. Progress to regular foods as tolerated. Avoid greasy, spicy, heavy foods. If nausea and/or vomiting occur, drink only clear liquids until the nausea and/or vomiting subsides. Call your physician if vomiting continues.  Special Instructions/Symptoms: Your throat may feel dry or sore from the anesthesia or the breathing tube placed in your throat during surgery. If this causes discomfort, gargle with warm salt water. The discomfort should disappear within 24 hours.  If you had a scopolamine patch placed behind your ear for the management of post- operative nausea and/or vomiting:  1. The medication in the patch is effective for 72 hours, after which it should be removed.  Wrap patch in a tissue and discard in the trash. Wash hands thoroughly with soap and water. 2. You may remove the patch earlier than 72 hours if you experience unpleasant side effects which may include dry mouth, dizziness or visual disturbances. 3. Avoid touching the patch. Wash your hands with soap and water after contact with the patch.     [phone] ? [phone] ? FAX [phone] Web site: [email or web address]    Information for Discharge Teaching: EXPAREL (bupivacaine liposome injectable suspension)   Your surgeon or anesthesiologist gave you EXPAREL(bupivacaine) to help control your pain after surgery.   EXPAREL is a local anesthetic that provides pain relief by numbing the tissue around the surgical site.  EXPAREL is designed to release pain medication over time  and can control pain for up to 72 hours.  Depending on how you respond to EXPAREL, you may require less pain medication during your recovery.  Possible side effects:  Temporary loss of sensation or ability to move in the area where bupivacaine was injected.  Nausea, vomiting, constipation  Rarely, numbness and tingling in your mouth or lips, lightheadedness, or anxiety may occur.  Call your doctor right away if you think you may be experiencing any of these sensations, or if you have other questions regarding possible side effects.  Follow all other discharge instructions given to you by your surgeon or nurse. Eat a healthy diet and drink plenty of water or other fluids.  If you return to the hospital for any reason within 96 hours following the administration of EXPAREL, it is important for health care providers to know that you have received this anesthetic. A teal colored band has been placed on your arm with the date, time and amount of EXPAREL you have received in order to alert and inform your health care providers. Please leave this armband in place for the full 96 hours following administration, and then you may remove the band.    Safe Surgery and Sleep Apnea Sleep apnea is a condition in which breathing pauses or becomes shallow during sleep. Most people with the condition are not aware that they have it. It is important for your health care providers to know  whether or not you have sleep apnea, especially if you are having surgery. Sleep apnea can increase your risk of complications during and after surgery. What is sleep apnea screening? Sleep apnea screening is a test to determine if you are at risk for sleep apnea. Before you have surgery, get screened for sleep apnea and talk with your surgeon and primary health care provider about your results. Screening usually involves answering a list of questions about your sleep quality. Ask your health care provider if you can be  screened, or take a screening test yourself. You can find these tests online at the American Sleep Apnea Association website. Some questions you may be asked include:  Do you snore?  Is your sleep restless?  Do you have daytime sleepiness?  Has a partner or spouse told you that you stop breathing during sleep?  Have you had trouble concentrating or memory loss? Answer these questions honestly. If a screening test is positive, this means you are at risk for the condition. Further testing may be needed to confirm a diagnosis of sleep apnea. Why does sleep apnea increase the risk for complications? Untreated sleep apnea increases the risk for certain complications during and after surgery. This is because when you have sleep apnea, your airways are more sensitive to medicines used during surgery. The airways can collapse and block the flow of air.  Having untreated sleep apnea can increase your risk for:  A longer stay in the recovery room or hospital.  Breathing difficulties such as low oxygen levels after surgery.  Increased pain after surgery.  Irregular heart rhythms.  Stroke.  Heart attack. You and your health care provider can take steps to help prevent these and other complications. What should I do if I have sleep apnea?  Before surgery  Tell your health care provider and anesthesia specialist that you have sleep apnea. Discuss your individual risks based on your screening results, the type of surgery you will be having, and other medical conditions that you have.  If you have a sleep apnea device (positive airway pressure device), wear it as prescribed. If you have not been wearing your device, talk with your health care provider about why you have not been wearing it. There are ways to improve your use of the device, such as: ? Adjusting the mask. ? Adding humidified air. ? Getting treatment for nasal congestion.  Do not use any products that contain nicotine or tobacco,  such as cigarettes and e-cigarettes. If you need help quitting, ask your health care provider. On the day of surgery  If instructed by your health care provider, bring your sleep apnea device with you.  Wear your sleep apnea device when you are sleeping during your hospital stay, or as told by your health care provider.  Ask your health care provider what special considerations will be taken during and after your surgery. After surgery  You may need to be given extra oxygen and wear a continuous oxygen monitor (pulse oximetry).  For your safety, you may need to stay in the recovery room or hospital for longer than is normal.  Follow instructions from your health care provider about wearing your sleep device: ? Anytime you are sleeping, including during daytime naps. ? While taking prescription pain medicines, sleeping medicines, or medicines that make you drowsy.  If your health care provider approves, raise the head of your bed or lie on your side. Do not lie flat on your back.  Follow instructions from  your health care provider about medicines: ? Avoid using sleep medicines unless they are prescribed by a health care provider who is aware of the results of your sleep apnea screening. ? Avoid using sleep medicines while taking opioid pain medicine. ? Limit your use of opioid pain medicines as much as possible. Ask your health care provider what is a safe amount to use. ? Ask about using pain medicines that do not affect your breathing, such as NSAIDs or acetaminophen. Where to find more information For more information about sleep apnea screening and healthy sleep, visit these websites:  Centers for Disease Control and Prevention: [employer]pl  American Sleep Apnea Association: www.sleepapnea.org Contact a health care provider if:  You have sleep apnea or think you may be at risk for sleep apnea, and you are scheduled for surgery. Get help right away if:  You have  trouble breathing.  You are very drowsy and cannot stay awake.  You are told that you have pauses in your breathing during sleep after surgery.  You have chest pain.  You have a fast heartbeat. Summary  It is important for your health care providers to know whether or not you have sleep apnea, especially if you are having surgery.  If you have sleep apnea, you are at an increased risk for complications during surgery.  You and your health care provider can take precautions to help prevent complications. If you have sleep apnea, make sure to tell your health care provider and anesthesia specialist. This information is not intended to replace advice given to you by your health care provider. Make sure you discuss any questions you have with your health care provider. Document Revised: 10/13/2018 Document Reviewed: 10/07/2016 Elsevier Patient Education  [place].

## 2019-09-19 NOTE — Op Note (Signed)
EXAM UNDER ANESTHESIA, EXCISION OF ANAL POLYP  Procedure Note  Jorge Barker 09/19/2019   Pre-op Diagnosis: ANAL POLYP     Post-op Diagnosis: same  Procedure(s): EXAM UNDER ANESTHESIA EXCISION OF ANAL POLYP  Surgeon(s): Coralie Keens, MD  Anesthesia: General  Staff:  Circulator: Ted Mcalpine, RN Relief Circulator: Maurene Capes, RN Scrub Person: Flavia Shipper, [place] Circulator Assistant: McDonough-Hughes, Delene Ruffini, RN  Estimated Blood Loss: Minimal               Specimens: sent to path  Indications: This is a 64 year old gentleman who was found to have an anal polyp on recent colonoscopy.  Because of the location at the anal verge, it could not be removed endoscopically therefore surgical excision was recommended  Procedure: The patient was brought to the operating room and identifies correct patient.  He was placed upon the operating table general anesthesia was induced.  The patient was then placed in the lithotomy position.  His perianal area was prepped and draped in usual sterile fashion.  I injected around the anus circumferentially with a mixture of Exparel and Marcaine.  I inserted an a retractor into the anal canal and easily then applied the large polyp which was approximately the 9 o'clock position with the patient in lithotomy.  I grasped it with an Allis clamp and then resected at with the harmonic scalpel.  The polyp was sent to pathology.  I then closed the mucosa with 2 separate running 2-0 chromic sutures.  I then placed a large piece of Gelfoam into the anal canal.  Hemostasis appeared to be achieved.  I injected the area again with more Exparel.  The patient did have several large external hemorrhoids which he did not want to have excised.  He tolerated the procedure well.  All the counts were correct at the end of the procedure.  He was then extubated in the operating room and taken in stable addition to the recovery room.          Coralie Keens    Date: 09/19/2019  Time: 1:04 PM

## 2019-09-19 NOTE — Interval H&P Note (Signed)
History and Physical Interval Note:no change in H and P  09/19/2019 12:28 PM  Jorge Barker  has presented today for surgery, with the diagnosis of ANAL POLYP.  The various methods of treatment have been discussed with the patient and family. After consideration of risks, benefits and other options for treatment, the patient has consented to  Procedure(s): EXAM UNDER ANESTHESIA (N/A) EXCISION OF ANAL POLYP (N/A) as a surgical intervention.  The patient's history has been reviewed, patient examined, no change in status, stable for surgery.  I have reviewed the patient's chart and labs.  Questions were answered to the patient's satisfaction.     Coralie Keens

## 2019-09-19 NOTE — Transfer of Care (Signed)
Immediate Anesthesia Transfer of Care Note  Patient: Jorge Barker  Procedure(s) Performed: Jasmine December UNDER ANESTHESIA (N/A Anus) EXCISION OF ANAL POLYP (N/A Anus)  Patient Location: PACU  Anesthesia Type:General  Level of Consciousness: drowsy and patient cooperative  Airway & Oxygen Therapy: Patient Spontanous Breathing and Patient connected to face mask oxygen  Post-op Assessment: Report given to RN and Post -op Vital signs reviewed and stable  Post vital signs: Reviewed and stable  Last Vitals:  Vitals Value Taken Time  BP 129/80 09/19/19 1311  Temp    Pulse 70 09/19/19 1314  Resp 29 09/19/19 1314  SpO2 98 % 09/19/19 1314  Vitals shown include unvalidated device data.  Last Pain:  Vitals:   09/19/19 1127  TempSrc: Oral  PainSc: 0-No pain      Patients Stated Pain Goal: 2 ([id number])  Complications: No apparent anesthesia complications

## 2019-09-19 NOTE — Anesthesia Preprocedure Evaluation (Signed)
Anesthesia Evaluation  Patient identified by MRN, date of birth, ID band Patient awake    Reviewed: Allergy & Precautions, NPO status , Patient's Chart, lab work & pertinent test results  Airway Mallampati: II  TM Distance: >3 FB Neck ROM: Full    Dental no notable dental hx. (+) Teeth Intact   Pulmonary sleep apnea and Continuous Positive Airway Pressure Ventilation , former smoker,    Pulmonary exam normal breath sounds clear to auscultation       Cardiovascular hypertension, Pt. on medications Normal cardiovascular exam Rhythm:Regular Rate:Normal     Neuro/Psych negative neurological ROS  negative psych ROS   GI/Hepatic negative GI ROS, Neg liver ROS,   Endo/Other  negative endocrine ROS  Renal/GU Renal disease   ED    Musculoskeletal  (+) Arthritis , Osteoarthritis,    Abdominal   Peds  Hematology negative hematology ROS (+)   Anesthesia Other Findings   Reproductive/Obstetrics                             Anesthesia Physical Anesthesia Plan  ASA: II  Anesthesia Plan: General   Post-op Pain Management:    Induction: Intravenous  PONV Risk Score and Plan: 3 and Ondansetron, Treatment may vary due to age or medical condition and Midazolam  Airway Management Planned: LMA and Oral ETT  Additional Equipment:   Intra-op Plan:   Post-operative Plan: Extubation in OR  Informed Consent: I have reviewed the patients History and Physical, chart, labs and discussed the procedure including the risks, benefits and alternatives for the proposed anesthesia with the patient or authorized representative who has indicated his/her understanding and acceptance.     Dental advisory given  Plan Discussed with: CRNA and Surgeon  Anesthesia Plan Comments:         Anesthesia Quick Evaluation

## 2019-09-20 ENCOUNTER — Encounter: Payer: Self-pay | Admitting: *Deleted

## 2019-09-20 LAB — SURGICAL PATHOLOGY

## 2020-07-14 ENCOUNTER — Encounter: Payer: Self-pay | Admitting: Emergency Medicine

## 2020-07-14 ENCOUNTER — Other Ambulatory Visit: Payer: Self-pay

## 2020-07-14 ENCOUNTER — Ambulatory Visit (INDEPENDENT_AMBULATORY_CARE_PROVIDER_SITE_OTHER): Payer: PPO | Admitting: Emergency Medicine

## 2020-07-14 VITALS — BP 138/82 | HR 67 | Temp 98.7°F | Resp 16 | Ht 73.0 in | Wt 238.0 lb

## 2020-07-14 DIAGNOSIS — Z6831 Body mass index (BMI) 31.0-31.9, adult: Secondary | ICD-10-CM

## 2020-07-14 DIAGNOSIS — Z125 Encounter for screening for malignant neoplasm of prostate: Secondary | ICD-10-CM | POA: Diagnosis not present

## 2020-07-14 DIAGNOSIS — Z0001 Encounter for general adult medical examination with abnormal findings: Secondary | ICD-10-CM | POA: Diagnosis not present

## 2020-07-14 DIAGNOSIS — G4730 Sleep apnea, unspecified: Secondary | ICD-10-CM | POA: Diagnosis not present

## 2020-07-14 DIAGNOSIS — Z1329 Encounter for screening for other suspected endocrine disorder: Secondary | ICD-10-CM

## 2020-07-14 DIAGNOSIS — Z13228 Encounter for screening for other metabolic disorders: Secondary | ICD-10-CM | POA: Diagnosis not present

## 2020-07-14 DIAGNOSIS — Z13220 Encounter for screening for lipoid disorders: Secondary | ICD-10-CM | POA: Diagnosis not present

## 2020-07-14 DIAGNOSIS — I10 Essential (primary) hypertension: Secondary | ICD-10-CM

## 2020-07-14 DIAGNOSIS — N5203 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 dysfunction: Secondary | ICD-10-CM | POA: Diagnosis not present

## 2020-07-14 DIAGNOSIS — N1831 Chronic kidney disease, stage 3a: Secondary | ICD-10-CM

## 2020-07-14 DIAGNOSIS — Z0000 Encounter for general adult medical examination without abnormal findings: Secondary | ICD-10-CM

## 2020-07-14 DIAGNOSIS — Z130 Encounter for screening for diseases of the blood and blood-forming organs and certain disorders involving the immune mechanism: Secondary | ICD-10-CM | POA: Diagnosis not present

## 2020-07-14 MED ORDER — TADALAFIL 20 MG PO TABS: ORAL_TABLET | ORAL | 11 refills | Status: DC

## 2020-07-14 NOTE — Patient Instructions (Addendum)
   If you have lab work done today you will be contacted with your lab results within the next 2 weeks.  If you have not heard from us then please contact us. The fastest way to get your results is to register for My Chart.   IF you received an x-ray today, you will receive an invoice from [facility]. Please contact [facility] at [phone] with questions or concerns regarding your invoice.   IF you received labwork today, you will receive an invoice from [facility]. Please contact [facility] at [phone] with questions or concerns regarding your invoice.   Our billing staff will not be able to assist you with questions regarding bills from these companies.  You will be contacted with the lab results as soon as they are available. The fastest way to get your results is to activate your My Chart account. Instructions are located on the last page of this paperwork. If you have not heard from us regarding the results in 2 weeks, please contact this office.      Health Maintenance, Male Adopting a healthy lifestyle and getting preventive care are important in promoting health and wellness. Ask your health care provider about:  The right schedule for you to have regular tests and exams.  Things you can do on your own to prevent diseases and keep yourself healthy. What should I know about diet, weight, and exercise? Eat a healthy diet  Eat a diet that includes plenty of vegetables, fruits, low-fat dairy products, and lean protein.  Do not eat a lot of foods that are high in solid fats, added sugars, or sodium.   Maintain a healthy weight Body mass index (BMI) is a measurement that can be used to identify possible weight problems. It estimates body fat based on height and weight. Your health care provider can help determine your BMI and help you achieve or maintain a healthy weight. Get regular exercise Get regular exercise. This is one of the most important things you  can do for your health. Most adults should:  Exercise for at least 150 minutes each week. The exercise should increase your heart rate and make you sweat (moderate-intensity exercise).  Do strengthening exercises at least twice a week. This is in addition to the moderate-intensity exercise.  Spend less time sitting. Even light physical activity can be beneficial. Watch cholesterol and blood lipids Have your blood tested for lipids and cholesterol at 67 years of age, then have this test every 5 years. You may need to have your cholesterol levels checked more often if:  Your lipid or cholesterol levels are high.  You are older than 67 years of age.  You are at high risk for heart disease. What should I know about cancer screening? Many types of cancers can be detected early and may often be prevented. Depending on your health history and family history, you may need to have cancer screening at various ages. This may include screening for:  Colorectal cancer.  Prostate cancer.  Skin cancer.  Lung cancer. What should I know about heart disease, diabetes, and high blood pressure? Blood pressure and heart disease  High blood pressure causes heart disease and increases the risk of stroke. This is more likely to develop in people who have high blood pressure readings, are of African descent, or are overweight.  Talk with your health care provider about your target blood pressure readings.  Have your blood pressure checked: ? Every 3-5 years if you are   18-39 years of age. ? Every year if you are 40 years old or older.  If you are between the ages of 65 and 75 and are a current or former smoker, ask your health care provider if you should have a one-time screening for abdominal aortic aneurysm (AAA). Diabetes Have regular diabetes screenings. This checks your fasting blood sugar level. Have the screening done:  Once every three years after age 45 if you are at a normal weight and have  a low risk for diabetes.  More often and at a younger age if you are overweight or have a high risk for diabetes. What should I know about preventing infection? Hepatitis B If you have a higher risk for hepatitis B, you should be screened for this virus. Talk with your health care provider to find out if you are at risk for hepatitis B infection. Hepatitis C Blood testing is recommended for:  Everyone born from 1945 through 1965.  Anyone with known risk factors for hepatitis C. Sexually transmitted infections (STIs)  You should be screened each year for STIs, including gonorrhea and chlamydia, if: ? You are sexually active and are younger than 67 years of age. ? You are older than 67 years of age and your health care provider tells you that you are at risk for this type of infection. ? Your sexual activity has changed since you were last screened, and you are at increased risk for chlamydia or gonorrhea. Ask your health care provider if you are at risk.  Ask your health care provider about whether you are at high risk for HIV. Your health care provider may recommend a prescription medicine to help prevent HIV infection. If you choose to take medicine to prevent HIV, you should first get tested for HIV. You should then be tested every 3 months for as long as you are taking the medicine. Follow these instructions at home: Lifestyle  Do not use any products that contain nicotine or tobacco, such as cigarettes, e-cigarettes, and chewing tobacco. If you need help quitting, ask your health care provider.  Do not use street drugs.  Do not share needles.  Ask your health care provider for help if you need support or information about quitting drugs. Alcohol use  Do not drink alcohol if your health care provider tells you not to drink.  If you drink alcohol: ? Limit how much you have to 0-2 drinks a day. ? Be aware of how much alcohol is in your drink. In the [place] one drink equals one 12  oz bottle of beer (355 mL), one 5 oz glass of wine (148 mL), or one 1 oz glass of hard liquor (44 mL). General instructions  Schedule regular health, dental, and eye exams.  Stay current with your vaccines.  Tell your health care provider if: ? You often feel depressed. ? You have ever been abused or do not feel safe at home. Summary  Adopting a healthy lifestyle and getting preventive care are important in promoting health and wellness.  Follow your health care provider's instructions about healthy diet, exercising, and getting tested or screened for diseases.  Follow your health care provider's instructions on monitoring your cholesterol and blood pressure. This information is not intended to replace advice given to you by your health care provider. Make sure you discuss any questions you have with your health care provider. Document Revised: 06/14/2018 Document Reviewed: 06/14/2018 Elsevier Patient Education  2021 Elsevier Inc.  

## 2020-07-14 NOTE — Progress Notes (Signed)
Jorge Barker 65 y.o.   Chief Complaint  Patient presents with  . Annual Exam    HISTORY OF PRESENT ILLNESS: This is a 65 y.o. male here for annual exam.  Has the following chronic medical problems: 1.  Hypertension: On irbesartan 150 mg daily.  Normal blood pressure readings at home. 2.  Sleep apnea on CPAP therapy. 3.  Chronic kidney disease stage IIIa.  Has been seen and cleared by nephrologist last year. 4.  Chronic insomnia Fully vaccinated against COVID.  Also COVID disease prior to vaccination. Has no complaints or medical concerns today.  HPI   Prior to Admission medications   Medication Sig Start Date End Date Taking? Authorizing Provider  acetaminophen (TYLENOL) 500 MG tablet Take 500 mg by mouth every 6 (six) hours as needed.    [provider]  acyclovir (ZOVIRAX) 400 MG tablet Take 400 mg by mouth 3 (three) times daily. As needed 04/05/17   [provider]  irbesartan (AVAPRO) 150 MG tablet Take 150 mg by mouth daily.    [provider]  lidocaine (XYLOCAINE) 5 % ointment Apply 1 application topically as needed. 09/19/19   Coralie Keens, MD  tadalafil (CIALIS) 20 MG tablet TAKE 1/2-1 TAB BY MOUTH EVERY DAY AS NEEDED 07/12/19   Horald Pollen, MD  traMADol (ULTRAM) 50 MG tablet Take 1 tablet (50 mg total) by mouth every 6 (six) hours as needed for moderate pain or severe pain. 09/19/19   Coralie Keens, MD  Turmeric (QC TUMERIC COMPLEX PO) Take by mouth.    [provider]  zolpidem (AMBIEN) 10 MG tablet Take 1 tablet (10 mg total) by mouth at bedtime as needed for sleep. 07/12/19 10/10/19  Horald Pollen, MD    Allergies  Allergen Reactions  . Citalopram Hydrobromide     Felt hung-over  . Influenza Vaccines     Serum sickness like illness  . Oxycodone-Acetaminophen     REACTION: severe nausea and vomiting  . Percocet [Oxycodone-Acetaminophen]     Patient Active Problem List   Diagnosis Date Noted  . BMI  29.0-29.9,adult 06/21/2017  . Sleep apnea with use of continuous positive airway pressure (CPAP) 06/04/2013  . Hypersomnia with sleep apnea, unspecified 06/04/2013  . Erectile dysfunction   . Snoring   . HBP (high blood pressure) 02/03/2012  . Renal insufficiency 02/03/2012  . Insomnia 02/03/2012    Past Medical History:  Diagnosis Date  . Allergy   . Arthritis    RIGHT shoulder OA  . Chronic renal insufficiency, stage 2 (mild)    [name] every six months.  . Erectile dysfunction   . Hypertension   . Insomnia   . Sleep apnea    wears cpap   . Sleep apnea with use of continuous positive airway pressure (CPAP) 06/04/2013  . Snoring     Past Surgical History:  Procedure Laterality Date  . COLONOSCOPY    . HEMORRHOID SURGERY N/A 09/19/2019   Procedure: EXCISION OF ANAL POLYP;  Surgeon: Coralie Keens, MD;  Location: [place];  Service: General;  Laterality: N/A;  . KNEE ARTHROSCOPY Left    x2  . POLYPECTOMY    . thumb surgery Left   . TONSILLECTOMY     as child     Social History   Socioeconomic History  . Marital status: Married    Spouse name: [name]  . Number of children: 2  . Years of education: Assoc.  . Highest education level: Not on  file  Occupational History  . Occupation: drive bus part time    Employer: RETIRED  Tobacco Use  . Smoking status: Former Smoker    Years: 12.00    Types: Cigarettes    Quit date: 07/05/1981    Years since quitting: 39.0  . Smokeless tobacco: Never Used  Substance and Sexual Activity  . Alcohol use: Yes    Comment: drinks beer and spirit (RUM)/10oz weekly  . Drug use: No  . Sexual activity: Yes    Birth control/protection: Post-menopausal  Other Topics Concern  . Not on file  Social History Narrative   Patient is married ([name]) and lives at home with his wife.  Married x 1977.   Patient has two adult children.  Four grandchildren.      Lives: with wife.   Married. Education: [employer]. Exercise: Walk  daily 30-60 minutes. Patient is right handed and Consumes 1-2 cups of caffiene daily.      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       Tobacco; quit 35 years ago; smoked for 19-28 yo.      Alcohol: 3-7drinks per week       Exercise:  None in 2018; active in yard; yard is 1 acre.                    Social Determinants of Health   Financial Resource Strain: Not on file  Food Insecurity: Not on file  Transportation Needs: Not on file  Physical Activity: Not on file  Stress: Not on file  Social Connections: Not on file  Intimate Partner Violence: Not on file    Family History  Problem Relation Age of Onset  . Cancer Mother        intestinal cancer/abdomen- pt unsure what cancer mother actually had   . Colon polyps Father   . Colon cancer Neg Hx   . Esophageal cancer Neg Hx   . Rectal cancer Neg Hx   . Stomach cancer Neg Hx      Review of Systems  Constitutional: Negative.  Negative for chills and fever.  HENT: Negative.  Negative for congestion and sore throat.   Respiratory: Negative.  Negative for cough and shortness of breath.   Cardiovascular: Negative.  Negative for chest pain and palpitations.  Gastrointestinal: Negative.  Negative for abdominal pain, diarrhea, nausea and vomiting.  Genitourinary: Negative.  Negative for dysuria and hematuria.  Musculoskeletal: Negative.   Skin: Negative.   Neurological: Negative.  Negative for dizziness and headaches.  All other systems reviewed and are negative.   Vitals:   07/14/20 0801  BP: (!) 172/83  Pulse: 67  Resp: 16  Temp: 98.7 F (37.1 C)  SpO2: 96%    Physical Exam Vitals reviewed.  Constitutional:      Appearance: Normal appearance.  HENT:     Head: Normocephalic.  Eyes:     Extraocular Movements: Extraocular movements intact.     Pupils: Pupils are equal, round, and reactive  to light.  Cardiovascular:     Rate and Rhythm: Normal rate and regular rhythm.     Pulses: Normal pulses.     Heart sounds: Normal heart sounds.  Pulmonary:     Effort: Pulmonary effort is normal.     Breath sounds: Normal breath sounds.  Abdominal:     General: Bowel  sounds are normal. There is no distension.     Palpations: Abdomen is soft.     Tenderness: There is no abdominal tenderness.  Musculoskeletal:        General: Normal range of motion.     Cervical back: Normal range of motion and neck supple.  Skin:    General: Skin is warm and dry.     Capillary Refill: Capillary refill takes less than 2 seconds.  Neurological:     General: No focal deficit present.     Mental Status: He is alert and oriented to person, place, and time.  Psychiatric:        Mood and Affect: Mood normal.        Behavior: Behavior normal.      ASSESSMENT & PLAN: Onalee HuaDavid was seen today for annual exam and medication refill.  Diagnoses and all orders for this visit:  Routine general medical examination at a health care facility  Stage 3a chronic kidney disease (HCC) -     Comprehensive metabolic panel  Essential hypertension -     Comprehensive metabolic panel  Sleep apnea with use of continuous positive airway pressure (CPAP)  Screening for lipoid disorders -     Lipid panel  Screening for endocrine, metabolic and immunity disorder -     Hemoglobin A1c  Prostate cancer screening -     PSA  Screening for deficiency anemia -     CBC with Differential/Platelet  Body mass index (BMI) of 31.0-31.9 in adult  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 dysfunction -     tadalafil (CIALIS) 20 MG tablet; TAKE 1/2-1 TAB BY MOUTH EVERY DAY AS NEEDED    Patient Instructions       If you have lab work done today you will be contacted with your lab results within the next 2 weeks.  If you have not heard from us then please contact us. The fastest way to get  your results is to register for My Chart.   IF you received an x-ray today, you will [facility]. Please contact [facility] at [phone] with questions or concerns regarding your invoice.   IF you received labwork today, you will receive an invoice from [facility]. Please contact [facility] at [phone] with questions or concerns regarding your invoice.   Our billing staff will not be able to assist you with questions regarding bills from these companies.  You will be contacted with the lab results as soon as they are available. The fastest way to get your results is to activate your My Chart account. Instructions are located on the last page of this paperwork. If you have not heard from us regarding the results in 2 weeks, please contact this office.      Health Maintenance, Male Adopting a healthy lifestyle and getting preventive care are important in promoting health and wellness. Ask your health care provider about:  The right schedule for you to have regular tests and exams.  Things you can do on your own to prevent diseases and keep yourself healthy. What should I know about diet, weight, and exercise? Eat a healthy diet  Eat a diet that includes plenty of vegetables, fruits, low-fat dairy products, and lean protein.  Do not eat a lot of foods that are high in solid fats, added sugars, or sodium.   Maintain a healthy weight Body mass index (BMI) is a measurement that can be used to identify possible weight problems. It  estimates body fat based on height and weight. Your health care provider can help determine your BMI and help you achieve or maintain a healthy weight. Get regular exercise Get regular exercise. This is one of the most important things you can do for your health. Most adults should:  Exercise for at least 150 minutes each week. The exercise should increase your heart rate and make you sweat (moderate-intensity  exercise).  Do strengthening exercises at least twice a week. This is in addition to the moderate-intensity exercise.  Spend less time sitting. Even light physical activity can be beneficial. Watch cholesterol and blood lipids Have your blood tested for lipids and cholesterol at 67 years of age, then have this test every 5 years. You may need to have your cholesterol levels checked more often if:  Your lipid or cholesterol levels are high.  You are older than 67 years of age.  You are at high risk for heart disease. What should I know about cancer screening? Many types of cancers can be detected early and may often be prevented. Depending on your health history and family history, you may need to have cancer screening at various ages. This may include screening for:  Colorectal cancer.  Prostate cancer.  Skin cancer.  Lung cancer. What should I know about heart disease, diabetes, and high blood pressure? Blood pressure and heart disease  High blood pressure causes heart disease and increases the risk of stroke. This is more likely to develop in people who have high blood pressure readings, are of African descent, or are overweight.  Talk with your health care provider about your target blood pressure readings.  Have your blood pressure checked: ? Every 3-5 years if you are 48-11 years of age. ? Every year if you are 66 years old or older.  If you are between the ages of 76 and 3 and are a current or former smoker, ask your health care provider if you should have a one-time screening for abdominal aortic aneurysm (AAA). Diabetes Have regular diabetes screenings. This checks your fasting blood sugar level. Have the screening done:  Once every three years after age 61 if you are at a normal weight and have a low risk for diabetes.  More often and at a younger age if you are overweight or have a high risk for diabetes. What should I know about preventing infection? Hepatitis  B If you have a higher risk for hepatitis B, you should be screened for this virus. Talk with your health care provider to find out if you are at risk for hepatitis B infection. Hepatitis C Blood testing is recommended for:  Everyone born from [birth year] through 1965.  Anyone with known risk factors for hepatitis C. Sexually transmitted infections (STIs)  You should be screened each year for STIs, including gonorrhea and chlamydia, if: ? You are sexually active and are younger than 67 years of age. ? You are older than 67 years of age and your health care provider tells you that you are at risk for this type of infection. ? Your sexual activity has changed since you were last screened, and you are at increased risk for chlamydia or gonorrhea. Ask your health care provider if you are at risk.  Ask your health care provider about whether you are at high risk for HIV. Your health care provider may recommend a prescription medicine to help prevent HIV infection. If you choose to take medicine to prevent HIV, you should first get  tested for HIV. You should then be tested every 3 months for as long as you are taking the medicine. Follow these instructions at home: Lifestyle  Do not use any products that contain nicotine or tobacco, such as cigarettes, e-cigarettes, and chewing tobacco. If you need help quitting, ask your health care provider.  Do not use street drugs.  Do not share needles.  Ask your health care provider for help if you need support or information about quitting drugs. Alcohol use  Do not drink alcohol if your health care provider tells you not to drink.  If you drink alcohol: ? Limit how much you have to 0-2 drinks a day. ? Be aware of how much alcohol is in your drink. In the [place] one drink equals one 12 oz bottle of beer (355 mL), one 5 oz glass of wine (148 mL), or one 1 oz glass of hard liquor (44 mL). General instructions  Schedule regular health, dental, and eye  exams.  Stay current with your vaccines.  Tell your health care provider if: ? You often feel depressed. ? You have ever been abused or do not feel safe at home. Summary  Adopting a healthy lifestyle and getting preventive care are important in promoting health and wellness.  Follow your health care provider's instructions about healthy diet, exercising, and getting tested or screened for diseases.  Follow your health care provider's instructions on monitoring your cholesterol and blood pressure. This information is not intended to replace advice given to you by your health care provider. Make sure you discuss any questions you have with your health care provider. Document Revised: 06/14/2018 Document Reviewed: 06/14/2018 Elsevier Patient Education  2021 Elsevier Inc.      Agustina Caroli, MD [facility]

## 2020-07-15 LAB — COMPREHENSIVE METABOLIC PANEL
ALT: 22 IU/L (ref 0–44)
AST: 24 IU/L (ref 0–40)
Albumin/Globulin Ratio: 2.1 (ref 1.2–2.2)
Albumin: 4.4 g/dL (ref 3.8–4.8)
Alkaline Phosphatase: 60 IU/L (ref 44–121)
BUN/Creatinine Ratio: 9 — ABNORMAL LOW (ref 10–24)
BUN: 14 mg/dL (ref 8–27)
Bilirubin Total: 0.6 mg/dL (ref 0.0–1.2)
CO2: 21 mmol/L (ref 20–29)
Calcium: 9.7 mg/dL (ref 8.6–10.2)
Chloride: 103 mmol/L (ref 96–106)
Creatinine, Ser: 1.53 mg/dL — ABNORMAL HIGH (ref 0.76–1.27)
GFR calc Af Amer: 54 mL/min/{1.73_m2} — ABNORMAL LOW (ref >59)
GFR calc non Af Amer: 47 mL/min/{1.73_m2} — ABNORMAL LOW (ref >59)
Globulin, Total: 2.1 g/dL (ref 1.5–4.5)
Glucose: 118 mg/dL — ABNORMAL HIGH (ref 65–99)
Potassium: 4.4 mmol/L (ref 3.5–5.2)
Sodium: 141 mmol/L (ref 134–144)
Total Protein: 6.5 g/dL (ref 6.0–8.5)

## 2020-07-15 LAB — CBC WITH DIFFERENTIAL/PLATELET
Basophils Absolute: 0.1 10*3/uL (ref 0.0–0.2)
Basos: 1 %
EOS (ABSOLUTE): 0.2 10*3/uL (ref 0.0–0.4)
Eos: 2 %
Hematocrit: 47.6 % (ref 37.5–51.0)
Hemoglobin: 16.2 g/dL (ref 13.0–17.7)
Immature Grans (Abs): 0 10*3/uL (ref 0.0–0.1)
Immature Granulocytes: 0 %
Lymphocytes Absolute: 2 10*3/uL (ref 0.7–3.1)
Lymphs: 26 %
MCH: 30 pg (ref 26.6–33.0)
MCHC: 34 g/dL (ref 31.5–35.7)
MCV: 88 fL (ref 79–97)
Monocytes Absolute: 0.8 10*3/uL (ref 0.1–0.9)
Monocytes: 10 %
Neutrophils Absolute: 4.5 10*3/uL (ref 1.4–7.0)
Neutrophils: 61 %
Platelets: 275 10*3/uL (ref 150–450)
RBC: 5.4 x10E6/uL (ref 4.14–5.80)
RDW: 12 % (ref 11.6–15.4)
WBC: 7.5 10*3/uL (ref 3.4–10.8)

## 2020-07-15 LAB — LIPID PANEL
Chol/HDL Ratio: 3.1 ratio (ref 0.0–5.0)
Cholesterol, Total: 175 mg/dL (ref 100–199)
HDL: 56 mg/dL (ref >39)
LDL Chol Calc (NIH): 98 mg/dL (ref 0–99)
Triglycerides: 119 mg/dL (ref 0–149)
VLDL Cholesterol Cal: 21 mg/dL (ref 5–40)

## 2020-07-15 LAB — HEMOGLOBIN A1C
Est. average glucose Bld gHb Est-mCnc: 120 mg/dL
Hgb A1c MFr Bld: 5.8 % — ABNORMAL HIGH (ref 4.8–5.6)

## 2020-07-15 LAB — PSA: Prostate Specific Ag, Serum: 1 ng/mL (ref 0.0–4.0)

## 2020-07-16 ENCOUNTER — Other Ambulatory Visit: Payer: Self-pay | Admitting: Emergency Medicine

## 2020-07-16 DIAGNOSIS — F5102 Adjustment insomnia: Secondary | ICD-10-CM

## 2020-07-16 MED ORDER — ZOLPIDEM TARTRATE 10 MG PO TABS: 10.0000 mg | ORAL_TABLET | Freq: Every evening | ORAL | 5 refills | Status: DC | PRN

## 2020-07-16 NOTE — Telephone Encounter (Signed)
New prescription was sent. Thanks.

## 2020-07-21 ENCOUNTER — Other Ambulatory Visit: Payer: Self-pay | Admitting: Emergency Medicine

## 2020-07-21 DIAGNOSIS — N5203 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 dysfunction: Secondary | ICD-10-CM

## 2020-09-01 ENCOUNTER — Encounter: Payer: Self-pay | Admitting: Emergency Medicine

## 2020-09-01 ENCOUNTER — Other Ambulatory Visit: Payer: Self-pay | Admitting: Emergency Medicine

## 2020-09-01 MED ORDER — IRBESARTAN 150 MG PO TABS: 150.0000 mg | ORAL_TABLET | Freq: Every day | ORAL | 3 refills | Status: DC

## 2021-01-01 ENCOUNTER — Other Ambulatory Visit: Payer: Self-pay | Admitting: Emergency Medicine

## 2021-01-01 DIAGNOSIS — N5203 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 dysfunction: Secondary | ICD-10-CM

## 2021-02-14 ENCOUNTER — Other Ambulatory Visit: Payer: Self-pay | Admitting: Emergency Medicine

## 2021-02-14 DIAGNOSIS — N5203 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 dysfunction: Secondary | ICD-10-CM

## 2021-04-16 ENCOUNTER — Other Ambulatory Visit: Payer: Self-pay | Admitting: Emergency Medicine

## 2021-04-16 DIAGNOSIS — N5203 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 dysfunction: Secondary | ICD-10-CM

## 2021-05-20 ENCOUNTER — Other Ambulatory Visit: Payer: Self-pay | Admitting: Emergency Medicine

## 2021-05-20 ENCOUNTER — Encounter: Payer: Self-pay | Admitting: Emergency Medicine

## 2021-05-20 DIAGNOSIS — N5203 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 dysfunction: Secondary | ICD-10-CM

## 2021-05-20 DIAGNOSIS — G4730 Sleep apnea, unspecified: Secondary | ICD-10-CM

## 2021-05-21 MED ORDER — TADALAFIL 20 MG PO TABS: 20.0000 mg | ORAL_TABLET | Freq: Every day | ORAL | 5 refills | Status: DC | PRN

## 2021-05-25 NOTE — Telephone Encounter (Signed)
Placed an order for CPAP machine, will fax to [facility].

## 2021-06-02 DIAGNOSIS — G4733 Obstructive sleep apnea (adult) (pediatric): Secondary | ICD-10-CM | POA: Diagnosis not present

## 2021-06-09 ENCOUNTER — Encounter: Payer: PPO | Admitting: Emergency Medicine

## 2021-07-02 DIAGNOSIS — G4733 Obstructive sleep apnea (adult) (pediatric): Secondary | ICD-10-CM | POA: Diagnosis not present

## 2021-07-15 ENCOUNTER — Ambulatory Visit (INDEPENDENT_AMBULATORY_CARE_PROVIDER_SITE_OTHER): Payer: PPO | Admitting: Emergency Medicine

## 2021-07-15 ENCOUNTER — Other Ambulatory Visit: Payer: Self-pay

## 2021-07-15 ENCOUNTER — Encounter: Payer: Self-pay | Admitting: Emergency Medicine

## 2021-07-15 VITALS — BP 130/70 | HR 70 | Temp 98.1°F | Ht 73.0 in | Wt 240.0 lb

## 2021-07-15 DIAGNOSIS — Z13228 Encounter for screening for other metabolic disorders: Secondary | ICD-10-CM | POA: Diagnosis not present

## 2021-07-15 DIAGNOSIS — Z1329 Encounter for screening for other suspected endocrine disorder: Secondary | ICD-10-CM

## 2021-07-15 DIAGNOSIS — G8929 Other chronic pain: Secondary | ICD-10-CM | POA: Diagnosis not present

## 2021-07-15 DIAGNOSIS — Z0000 Encounter for general adult medical examination without abnormal findings: Secondary | ICD-10-CM

## 2021-07-15 DIAGNOSIS — Z125 Encounter for screening for malignant neoplasm of prostate: Secondary | ICD-10-CM

## 2021-07-15 DIAGNOSIS — M19011 Primary osteoarthritis, right shoulder: Secondary | ICD-10-CM

## 2021-07-15 DIAGNOSIS — Z130 Encounter for screening for diseases of the blood and blood-forming organs and certain disorders involving the immune mechanism: Secondary | ICD-10-CM

## 2021-07-15 DIAGNOSIS — M25511 Pain in right shoulder: Secondary | ICD-10-CM

## 2021-07-15 DIAGNOSIS — Z13220 Encounter for screening for lipoid disorders: Secondary | ICD-10-CM | POA: Diagnosis not present

## 2021-07-15 DIAGNOSIS — Z23 Encounter for immunization: Secondary | ICD-10-CM | POA: Diagnosis not present

## 2021-07-15 DIAGNOSIS — N5203 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 dysfunction: Secondary | ICD-10-CM | POA: Diagnosis not present

## 2021-07-15 LAB — CBC WITH DIFFERENTIAL/PLATELET
Basophils Absolute: 0.1 10*3/uL (ref 0.0–0.1)
Basophils Relative: 0.7 % (ref 0.0–3.0)
Eosinophils Absolute: 0.1 10*3/uL (ref 0.0–0.7)
Eosinophils Relative: 1.9 % (ref 0.0–5.0)
HCT: 49.7 % (ref 39.0–52.0)
Hemoglobin: 16.1 g/dL (ref 13.0–17.0)
Lymphocytes Relative: 25 % (ref 12.0–46.0)
Lymphs Abs: 1.9 10*3/uL (ref 0.7–4.0)
MCHC: 32.5 g/dL (ref 30.0–36.0)
MCV: 90.8 fl (ref 78.0–100.0)
Monocytes Absolute: 0.8 10*3/uL (ref 0.1–1.0)
Monocytes Relative: 10.9 % (ref 3.0–12.0)
Neutro Abs: 4.7 10*3/uL (ref 1.4–7.7)
Neutrophils Relative %: 61.5 % (ref 43.0–77.0)
Platelets: 253 10*3/uL (ref 150.0–400.0)
RBC: 5.48 Mil/uL (ref 4.22–5.81)
RDW: 14.4 % (ref 11.5–15.5)
WBC: 7.7 10*3/uL (ref 4.0–10.5)

## 2021-07-15 LAB — COMPREHENSIVE METABOLIC PANEL
ALT: 20 U/L (ref 0–53)
AST: 21 U/L (ref 0–37)
Albumin: 4.3 g/dL (ref 3.5–5.2)
Alkaline Phosphatase: 54 U/L (ref 39–117)
BUN: 14 mg/dL (ref 6–23)
CO2: 30 mEq/L (ref 19–32)
Calcium: 9.2 mg/dL (ref 8.4–10.5)
Chloride: 104 mEq/L (ref 96–112)
Creatinine, Ser: 1.54 mg/dL — ABNORMAL HIGH (ref 0.40–1.50)
GFR: 46.35 mL/min — ABNORMAL LOW (ref >60.00)
Glucose, Bld: 105 mg/dL — ABNORMAL HIGH (ref 70–99)
Potassium: 5.2 mEq/L — ABNORMAL HIGH (ref 3.5–5.1)
Sodium: 139 mEq/L (ref 135–145)
Total Bilirubin: 0.7 mg/dL (ref 0.2–1.2)
Total Protein: 6.5 g/dL (ref 6.0–8.3)

## 2021-07-15 LAB — LIPID PANEL
Cholesterol: 161 mg/dL (ref 0–200)
HDL: 63.7 mg/dL (ref >39.00)
LDL Cholesterol: 86 mg/dL (ref 0–99)
NonHDL: 97.24
Total CHOL/HDL Ratio: 3
Triglycerides: 55 mg/dL (ref 0.0–149.0)
VLDL: 11 mg/dL (ref 0.0–40.0)

## 2021-07-15 LAB — PSA: PSA: 1.2 ng/mL (ref 0.10–4.00)

## 2021-07-15 LAB — HEMOGLOBIN A1C: Hgb A1c MFr Bld: 5.7 % (ref 4.6–6.5)

## 2021-07-15 MED ORDER — TADALAFIL 20 MG PO TABS: 20.0000 mg | ORAL_TABLET | Freq: Every day | ORAL | 11 refills | Status: DC | PRN

## 2021-07-15 NOTE — Patient Instructions (Signed)

## 2021-07-15 NOTE — Progress Notes (Signed)
Jorge Barker 66 y.o.   Chief Complaint  Patient presents with   Annual Exam    Discomfort in neck from shoulder pain    HISTORY OF PRESENT ILLNESS: This is a 66 y.o. male here for annual exam. Has history of hypertension on irbesartan 150 mg daily. Also has history of chronic right shoulder pain secondary to arthritis. Sees orthopedist on a regular basis.  Had MRI of shoulder done.  Recommended surgery. No other complaints or medical concerns today.  HPI   Prior to Admission medications   Medication Sig Start Date End Date Taking? Authorizing Provider  acetaminophen (TYLENOL) 500 MG tablet Take 500 mg by mouth every 6 (six) hours as needed.   Yes [provider]  acyclovir (ZOVIRAX) 400 MG tablet Take 400 mg by mouth 3 (three) times daily. As needed 04/05/17  Yes [provider]  irbesartan (AVAPRO) 150 MG tablet Take 1 tablet (150 mg total) by mouth daily. 09/01/20 07/15/21 Yes Ariez Neilan, Ines Bloomer, MD  lidocaine (XYLOCAINE) 5 % ointment Apply 1 application topically as needed. 09/19/19  Yes Coralie Keens, MD  tadalafil (CIALIS) 20 MG tablet Take 1 tablet (20 mg total) by mouth daily as needed. 05/21/21  Yes Nyair Depaulo, Ines Bloomer, MD  zolpidem (AMBIEN) 10 MG tablet Take 1 tablet (10 mg total) by mouth at bedtime as needed for sleep. 07/16/20 07/15/21 Yes ShilohInes Bloomer, MD    Allergies  Allergen Reactions   Citalopram Hydrobromide     Felt hung-over   Influenza Vaccines     Serum sickness like illness   Oxycodone-Acetaminophen     REACTION: severe nausea and vomiting   Percocet [Oxycodone-Acetaminophen]     Patient Active Problem List   Diagnosis Date Noted   BMI 29.0-29.9,adult 06/21/2017   Sleep apnea with use of continuous positive airway pressure (CPAP) 06/04/2013   Hypersomnia with sleep apnea, unspecified 06/04/2013   Erectile dysfunction    Snoring    HBP (high blood pressure) 02/03/2012   Renal insufficiency 02/03/2012   Insomnia  02/03/2012    Past Medical History:  Diagnosis Date   Allergy    Arthritis    RIGHT shoulder OA   Chronic renal insufficiency, stage 2 (mild)    [name] every six months.   Erectile dysfunction    Hypertension    Insomnia    Sleep apnea    wears cpap    Sleep apnea with use of continuous positive airway pressure (CPAP) 06/04/2013   Snoring     Past Surgical History:  Procedure Laterality Date   COLONOSCOPY     HEMORRHOID SURGERY N/A 09/19/2019   Procedure: EXCISION OF ANAL POLYP;  Surgeon: Coralie Keens, MD;  Location: [facility];  Service: General;  Laterality: N/A;   KNEE ARTHROSCOPY Left    x2   POLYPECTOMY     thumb surgery Left    TONSILLECTOMY     as child     Social History   Socioeconomic History   Marital status: Married    Spouse name: [name]   Number of children: 2   Years of education: Assoc.   Highest education level: Not on file  Occupational History   Occupation: drive bus part time    Employer: RETIRED  Tobacco Use   Smoking status: Former    Years: 12.00    Types: Cigarettes    Quit date: 07/05/1981    Years since quitting: 40.0   Smokeless tobacco: Never  Substance and Sexual Activity  Alcohol use: Yes    Comment: drinks beer and spirit (RUM)/10oz weekly   Drug use: No   Sexual activity: Yes    Birth control/protection: Post-menopausal  Other Topics Concern   Not on file  Social History Narrative   Patient is married ([name]) and lives at home with his wife.  Married x 1977.   Patient has two adult children.  Four grandchildren.      Lives: with wife.   Married. Education: [employer]. Exercise: Walk daily 30-60 minutes. Patient is right handed and Consumes 1-2 cups of caffiene daily.      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       Tobacco; quit 35 years ago; smoked for 74-30 yo.       Alcohol: 3-7drinks per week       Exercise:  None in 2018; active in yard; yard is 1 acre.                    Social Determinants of Health   Financial Resource Strain: Not on file  Food Insecurity: Not on file  Transportation Needs: Not on file  Physical Activity: Not on file  Stress: Not on file  Social Connections: Not on file  Intimate Partner Violence: Not on file    Family History  Problem Relation Age of Onset   Cancer Mother        intestinal cancer/abdomen- pt unsure what cancer mother actually had    Colon polyps Father    Colon cancer Neg Hx    Esophageal cancer Neg Hx    Rectal cancer Neg Hx    Stomach cancer Neg Hx      Review of Systems  Constitutional: Negative.  Negative for chills and fever.  HENT: Negative.  Negative for congestion and sore throat.   Eyes: Negative.   Respiratory: Negative.  Negative for cough and shortness of breath.   Cardiovascular: Negative.  Negative for chest pain and palpitations.  Gastrointestinal:  Negative for abdominal pain, diarrhea, nausea and vomiting.  Genitourinary: Negative.  Negative for dysuria and hematuria.  Musculoskeletal:  Positive for joint pain (Right shoulder) and neck pain.  Skin: Negative.  Negative for rash.  Neurological: Negative.  Negative for dizziness and headaches.  All other systems reviewed and are negative.  Today's Vitals   07/15/21 0909  BP: (!) 152/78  Pulse: 70  Temp: 98.1 F (36.7 C)  TempSrc: Oral  SpO2: 95%  Weight: 240 lb (108.9 kg)  Height: 6\' 1"  (1.854 m)   Body mass index is 31.66 kg/m.  Physical Exam Vitals reviewed.  Constitutional:      Appearance: Normal appearance.  HENT:     Head: Normocephalic.     Right Ear: Tympanic membrane, ear canal and external ear normal.     Left Ear: Tympanic membrane, ear canal and external ear normal.     Mouth/Throat:     Mouth: Mucous membranes are moist.     Pharynx: Oropharynx is clear.  Eyes:     Extraocular Movements:  Extraocular movements intact.     Conjunctiva/sclera: Conjunctivae normal.     Pupils: Pupils are equal, round, and reactive to light.  Cardiovascular:     Rate and Rhythm: Normal rate and regular rhythm.     Pulses: Normal pulses.     Heart sounds: Normal heart sounds.  Pulmonary:     Effort: Pulmonary effort is normal.     Breath sounds: Normal breath sounds.  Abdominal:     General: There is no distension.     Palpations: Abdomen is soft. There is no mass.     Tenderness: There is no abdominal tenderness.  Musculoskeletal:        General: Normal range of motion.     Cervical back: Normal range of motion. No tenderness.  Lymphadenopathy:     Cervical: No cervical adenopathy.  Skin:    General: Skin is warm and dry.     Capillary Refill: Capillary refill takes less than 2 seconds.  Neurological:     General: No focal deficit present.     Mental Status: He is alert and oriented to person, place, and time.  Psychiatric:        Mood and Affect: Mood normal.        Behavior: Behavior normal.     ASSESSMENT & PLAN: Problem List Items Addressed This Visit       Other   Erectile dysfunction   Relevant Medications   tadalafil (CIALIS) 20 MG tablet   Other Visit Diagnoses     Routine general medical examination at a health care facility    -  Primary   Prostate cancer screening       Relevant Orders   PSA(Must document that pt has been informed of limitations of PSA testing.)   Screening for deficiency anemia       Relevant Orders   CBC with Differential   Screening for lipoid disorders       Relevant Orders   Lipid panel   Screening for endocrine, metabolic and immunity disorder       Relevant Orders   Comprehensive metabolic panel   Hemoglobin A1c   Need for prophylactic vaccination against Streptococcus pneumoniae (pneumococcus)       Relevant Orders   Pneumococcal conjugate vaccine 20-valent (Prevnar 20)   Chronic right shoulder pain       Arthritis of right  shoulder region          Modifiable risk factors discussed with patient. Anticipatory guidance according to age provided. The following topics were also discussed: Social Determinants of Health Smoking.  Non-smoker Diet and nutrition Benefits of exercise Cancer screening and review of most recent colonoscopy report Vaccinations recommendations Cardiovascular risk assessment The 10-year ASCVD risk score (Arnett DK, et al., 2019) is: 19.7%   Values used to calculate the score:     Age: 16 years     Sex: Male     Is Non-Hispanic African American: No     Diabetic: No     Tobacco smoker: No     Systolic Blood Pressure: 169 mmHg     Is BP treated: Yes     HDL Cholesterol: 56 mg/dL     Total Cholesterol: 175 mg/dL Hypertension management. Mental health including depression and anxiety Fall and accident prevention  Patient Instructions  Health Maintenance, Male Adopting a healthy lifestyle and getting preventive care are important in promoting health and wellness. Ask your health care provider about: The right schedule for you to have regular tests and exams. Things you can do on your own to prevent diseases and keep yourself healthy. What should I know about diet, weight, and exercise? Eat a healthy diet  Eat a diet that includes plenty of vegetables, fruits, low-fat dairy products, and lean protein. Do not eat a lot of foods that  are high in solid fats, added sugars, or sodium. Maintain a healthy weight Body mass index (BMI) is a measurement that can be used to identify possible weight problems. It estimates body fat based on height and weight. Your health care provider can help determine your BMI and help you achieve or maintain a healthy weight. Get regular exercise Get regular exercise. This is one of the most important things you can do for your health. Most adults should: Exercise for at least 150 minutes each week. The exercise should increase your heart rate and make you  sweat (moderate-intensity exercise). Do strengthening exercises at least twice a week. This is in addition to the moderate-intensity exercise. Spend less time sitting. Even light physical activity can be beneficial. Watch cholesterol and blood lipids Have your blood tested for lipids and cholesterol at 68 years of age, then have this test every 5 years. You may need to have your cholesterol levels checked more often if: Your lipid or cholesterol levels are high. You are older than 68 years of age. You are at high risk for heart disease. What should I know about cancer screening? Many types of cancers can be detected early and may often be prevented. Depending on your health history and family history, you may need to have cancer screening at various ages. This may include screening for: Colorectal cancer. Prostate cancer. Skin cancer. Lung cancer. What should I know about heart disease, diabetes, and high blood pressure? Blood pressure and heart disease High blood pressure causes heart disease and increases the risk of stroke. This is more likely to develop in people who have high blood pressure readings or are overweight. Talk with your health care provider about your target blood pressure readings. Have your blood pressure checked: Every 3-5 years if you are 71-73 years of age. Every year if you are 68 years old or older. If you are between the ages of 74 and 59 and are a current or former smoker, ask your health care provider if you should have a one-time screening for abdominal aortic aneurysm (AAA). Diabetes Have regular diabetes screenings. This checks your fasting blood sugar level. Have the screening done: Once every three years after age 51 if you are at a normal weight and have a low risk for diabetes. More often and at a younger age if you are overweight or have a high risk for diabetes. What should I know about preventing infection? Hepatitis B If you have a higher risk for  hepatitis B, you should be screened for this virus. Talk with your health care provider to find out if you are at risk for hepatitis B infection. Hepatitis C Blood testing is recommended for: Everyone born from [birth year] through 1967. Anyone with known risk factors for hepatitis C. Sexually transmitted infections (STIs) You should be screened each year for STIs, including gonorrhea and chlamydia, if: You are sexually active and are younger than 68 years of age. You are older than 68 years of age and your health care provider tells you that you are at risk for this type of infection. Your sexual activity has changed since you were last screened, and you are at increased risk for chlamydia or gonorrhea. Ask your health care provider if you are at risk. Ask your health care provider about whether you are at high risk for HIV. Your health care provider may recommend a prescription medicine to help prevent HIV infection. If you choose to take medicine to prevent HIV, you should first  get tested for HIV. You should then be tested every 3 months for as long as you are taking the medicine. Follow these instructions at home: Alcohol use Do not drink alcohol if your health care provider tells you not to drink. If you drink alcohol: Limit how much you have to 0-2 drinks a day. Know how much alcohol is in your drink. In the [place] one drink equals one 12 oz bottle of beer (355 mL), one 5 oz glass of wine (148 mL), or one 1 oz glass of hard liquor (44 mL). Lifestyle Do not use any products that contain nicotine or tobacco. These products include cigarettes, chewing tobacco, and vaping devices, such as e-cigarettes. If you need help quitting, ask your health care provider. Do not use street drugs. Do not share needles. Ask your health care provider for help if you need support or information about quitting drugs. General instructions Schedule regular health, dental, and eye exams. Stay current with your  vaccines. Tell your health care provider if: You often feel depressed. You have ever been abused or do not feel safe at home. Summary Adopting a healthy lifestyle and getting preventive care are important in promoting health and wellness. Follow your health care provider's instructions about healthy diet, exercising, and getting tested or screened for diseases. Follow your health care provider's instructions on monitoring your cholesterol and blood pressure. This information is not intended to replace advice given to you by your health care provider. Make sure you discuss any questions you have with your health care provider. Document Revised: 11/10/2020 Document Reviewed: 11/10/2020 Elsevier Patient Education  2022 [place], MD [facility]

## 2021-07-31 DIAGNOSIS — M542 Cervicalgia: Secondary | ICD-10-CM | POA: Diagnosis not present

## 2021-07-31 DIAGNOSIS — M19011 Primary osteoarthritis, right shoulder: Secondary | ICD-10-CM | POA: Diagnosis not present

## 2021-08-02 DIAGNOSIS — G4733 Obstructive sleep apnea (adult) (pediatric): Secondary | ICD-10-CM | POA: Diagnosis not present

## 2021-08-06 DIAGNOSIS — S46811D Strain of other muscles, fascia and tendons at shoulder and upper arm level, right arm, subsequent encounter: Secondary | ICD-10-CM | POA: Diagnosis not present

## 2021-09-01 ENCOUNTER — Other Ambulatory Visit: Payer: Self-pay | Admitting: Emergency Medicine

## 2021-09-01 DIAGNOSIS — G4733 Obstructive sleep apnea (adult) (pediatric): Secondary | ICD-10-CM | POA: Diagnosis not present

## 2021-09-30 DIAGNOSIS — G4733 Obstructive sleep apnea (adult) (pediatric): Secondary | ICD-10-CM | POA: Diagnosis not present

## 2021-10-31 DIAGNOSIS — G4733 Obstructive sleep apnea (adult) (pediatric): Secondary | ICD-10-CM | POA: Diagnosis not present

## 2021-11-30 DIAGNOSIS — G4733 Obstructive sleep apnea (adult) (pediatric): Secondary | ICD-10-CM | POA: Diagnosis not present

## 2021-12-31 DIAGNOSIS — G4733 Obstructive sleep apnea (adult) (pediatric): Secondary | ICD-10-CM | POA: Diagnosis not present

## 2022-01-30 DIAGNOSIS — G4733 Obstructive sleep apnea (adult) (pediatric): Secondary | ICD-10-CM | POA: Diagnosis not present

## 2022-02-15 DIAGNOSIS — L218 Other seborrheic dermatitis: Secondary | ICD-10-CM | POA: Diagnosis not present

## 2022-02-15 DIAGNOSIS — C4442 Squamous cell carcinoma of skin of scalp and neck: Secondary | ICD-10-CM | POA: Diagnosis not present

## 2022-02-15 DIAGNOSIS — D044 Carcinoma in situ of skin of scalp and neck: Secondary | ICD-10-CM | POA: Diagnosis not present

## 2022-03-02 DIAGNOSIS — G4733 Obstructive sleep apnea (adult) (pediatric): Secondary | ICD-10-CM | POA: Diagnosis not present

## 2022-04-02 DIAGNOSIS — G4733 Obstructive sleep apnea (adult) (pediatric): Secondary | ICD-10-CM | POA: Diagnosis not present

## 2022-05-02 DIAGNOSIS — G4733 Obstructive sleep apnea (adult) (pediatric): Secondary | ICD-10-CM | POA: Diagnosis not present

## 2022-05-20 DIAGNOSIS — Z08 Encounter for follow-up examination after completed treatment for malignant neoplasm: Secondary | ICD-10-CM | POA: Diagnosis not present

## 2022-05-20 DIAGNOSIS — D225 Melanocytic nevi of trunk: Secondary | ICD-10-CM | POA: Diagnosis not present

## 2022-05-20 DIAGNOSIS — Z86007 Personal history of in-situ neoplasm of skin: Secondary | ICD-10-CM | POA: Diagnosis not present

## 2022-05-20 DIAGNOSIS — Z85828 Personal history of other malignant neoplasm of skin: Secondary | ICD-10-CM | POA: Diagnosis not present

## 2022-05-20 DIAGNOSIS — L821 Other seborrheic keratosis: Secondary | ICD-10-CM | POA: Diagnosis not present

## 2022-05-20 DIAGNOSIS — L570 Actinic keratosis: Secondary | ICD-10-CM | POA: Diagnosis not present

## 2022-05-20 DIAGNOSIS — L814 Other melanin hyperpigmentation: Secondary | ICD-10-CM | POA: Diagnosis not present

## 2022-06-02 DIAGNOSIS — G4733 Obstructive sleep apnea (adult) (pediatric): Secondary | ICD-10-CM | POA: Diagnosis not present

## 2022-07-07 ENCOUNTER — Telehealth: Payer: Self-pay

## 2022-07-07 ENCOUNTER — Ambulatory Visit (INDEPENDENT_AMBULATORY_CARE_PROVIDER_SITE_OTHER): Payer: PPO

## 2022-07-07 VITALS — Ht 73.0 in | Wt 235.0 lb

## 2022-07-07 DIAGNOSIS — Z0000 Encounter for general adult medical examination without abnormal findings: Secondary | ICD-10-CM | POA: Diagnosis not present

## 2022-07-07 NOTE — Progress Notes (Addendum)
Virtual Visit via Telephone Note  I connected with  Jorge Barker on 07/07/22 at  3:15 PM EST by telephone and verified that I am speaking with the correct person using two identifiers.  Location: Patient: Home Provider: [place] Persons participating in the virtual visit: [place]   I discussed the limitations, risks, security and privacy concerns of performing an evaluation and management service by telephone and the availability of in person appointments. The patient expressed understanding and agreed to proceed.  Interactive audio and video telecommunications were attempted between this nurse and patient, however failed, due to patient having technical difficulties OR patient did not have access to video capability.  We continued and completed visit with audio only.  Some vital signs may be absent or patient reported.   Sheral Flow, LPN  Subjective:   Jorge Barker is a 67 y.o. male who presents for an Initial Medicare Annual Wellness Visit.  Review of Systems     Cardiac Risk Factors include: advanced age (>28mn, >>29women);male gender;hypertension;obesity (BMI >30kg/m2);sedentary lifestyle     Objective:    Today's Vitals   07/07/22 1519  Weight: 235 lb (106.6 kg)  Height: '6\' 1"'$  (1.854 m)  PainSc: 0-No pain   Body mass index is 31 kg/m.     07/07/2022    3:21 PM 09/19/2019   11:23 AM 09/11/2019    2:18 PM  Advanced Directives  Does Patient Have a Medical Advance Directive? Yes Yes Yes  Type of AParamedicof [place] will    Does patient want to make changes to medical advance directive?   No - Patient declined  [place] of [place]? No - copy requested No - copy requested No - copy requested    Current Medications (verified) Outpatient Encounter Medications as of 07/07/2022  Medication Sig   acetaminophen (TYLENOL) 500 MG tablet Take 500 mg by mouth every 6 (six) hours  as needed.   acyclovir (ZOVIRAX) 400 MG tablet Take 400 mg by mouth 3 (three) times daily. As needed   irbesartan (AVAPRO) 150 MG tablet TAKE 1 TABLET BY MOUTH EVERY DAY   tadalafil (CIALIS) 20 MG tablet Take 1 tablet (20 mg total) by mouth daily as needed.   zolpidem (AMBIEN) 10 MG tablet Take 1 tablet (10 mg total) by mouth at bedtime as needed for sleep.   [DISCONTINUED] lidocaine (XYLOCAINE) 5 % ointment Apply 1 application topically as needed.   No facility-administered encounter medications on file as of 07/07/2022.    Allergies (verified) Citalopram hydrobromide, Influenza vaccines, Oxycodone-acetaminophen, and Percocet [oxycodone-acetaminophen]   History: Past Medical History:  Diagnosis Date   Allergy    Arthritis    RIGHT shoulder OA   Chronic renal insufficiency, stage 2 (mild)    [name] every six months.   Erectile dysfunction    Hypertension    Insomnia    Sleep apnea    wears cpap    Sleep apnea with use of continuous positive airway pressure (CPAP) 06/04/2013   Snoring    Past Surgical History:  Procedure Laterality Date   COLONOSCOPY     HEMORRHOID SURGERY N/A 09/19/2019   Procedure: EXCISION OF ANAL POLYP;  Surgeon: BCoralie Keens MD;  [facility]  Service: General;  Laterality: N/A;   KNEE ARTHROSCOPY Left    x2   POLYPECTOMY     thumb surgery Left    TONSILLECTOMY     as child  Family History  Problem Relation Age of Onset   Cancer Mother        intestinal cancer/abdomen- pt unsure what cancer mother actually had    Colon polyps Father    Colon cancer Neg Hx    Esophageal cancer Neg Hx    Rectal cancer Neg Hx    Stomach cancer Neg Hx    Social History   Socioeconomic History   Marital status: Married    Spouse name: [name]   Number of children: 2   Years of education: Assoc.   Highest education level: Not on file  Occupational History   Occupation: drive bus part time    Employer: RETIRED  Tobacco Use    Smoking status: Former    Years: 12.00    Types: Cigarettes    Quit date: 07/05/1981    Years since quitting: 41.0   Smokeless tobacco: Never  Substance and Sexual Activity   Alcohol use: Yes    Comment: drinks beer and spirit (RUM)/10oz weekly   Drug use: No   Sexual activity: Yes    Birth control/protection: Post-menopausal  Other Topics Concern   Not on file  Social History Narrative   Patient is married ([name]) and lives at home with his wife.  Married x 1977.   Patient has two adult children.  Four grandchildren.      Lives: with wife.   Married. Education: [employer]. Exercise: Walk daily 30-60 minutes. Patient is right handed and Consumes 1-2 cups of caffiene daily.      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       Tobacco; quit 35 years ago; smoked for 61-28 yo.      Alcohol: 3-7drinks per week       Exercise:  None in 2018; active in yard; yard is 1 acre.                    Social Determinants of Health   Financial Resource Strain: Low Risk  (07/07/2022)   Overall Financial Resource Strain (CARDIA)    Difficulty of Paying Living Expenses: Not hard at all  Food Insecurity: No Food Insecurity (07/07/2022)   Hunger Vital Sign    Worried About Running Out of Food in the Last Year: Never true    Ran Out of Food in the Last Year: Never true  Transportation Needs: No Transportation Needs (07/07/2022)   PRAPARE - Hydrologist (Medical): No    Lack of Transportation (Non-Medical): No  Physical Activity: Inactive (07/07/2022)   Exercise Vital Sign    Days of Exercise per Week: 0 days    Minutes of Exercise per Session: 0 min  Stress: No Stress Concern Present (07/07/2022)   [place]    Feeling of Stress : Not at all  Social Connections: [place]  (07/07/2022)   Social Connection and Isolation Panel [NHANES]    Frequency of Communication with Friends and Family: More than three times a week    Frequency of Social Gatherings with Friends and Family: More than three times a week    Attends Religious Services: More than 4 times per year    Active Member of [employer] or Organizations: Yes    Attends Archivist Meetings: More than 4 times per year  Marital Status: Married    Tobacco Counseling Counseling given: Not Answered   Clinical Intake:  Pre-visit preparation completed: Yes  Pain : No/denies pain Pain Score: 0-No pain     BMI - recorded: 31 Nutritional Status: BMI > 30  Obese Nutritional Risks: None Diabetes: No  How often do you need to have someone help you when you read instructions, pamphlets, or other written materials from your doctor or pharmacy?: 1 - Never What is the last grade level you completed in school?: HS[employer]; Associate's Degree  Diabetic? no  Interpreter Needed?: No  Information entered by :: Lisette Abu, LPN.   Activities of Daily Living    07/07/2022    3:24 PM  In your present state of health, do you have any difficulty performing the following activities:  Hearing? 0  Vision? 0  Difficulty concentrating or making decisions? 0  Walking or climbing stairs? 0  Dressing or bathing? 0  Doing errands, shopping? 0  Preparing Food and eating ? N  Using the Toilet? N  In the past six months, have you accidently leaked urine? N  Do you have problems with loss of bowel control? N  Managing your Medications? N  Managing your Finances? N  Housekeeping or managing your Housekeeping? N    Patient Care Team: Horald Pollen, MD as PCP - General (Internal Medicine)  Indicate any recent Medical Services you may have received from other than [facility] providers in the past year (date may be approximate).     Assessment:   This is a routine wellness examination for  Louie.  Hearing/Vision screen Hearing Screening - Comments:: Denies hearing difficulties   Vision Screening - Comments:: Wears rx glasses - up to date with routine eye exams.   Dietary issues and exercise activities discussed: Current Exercise Habits: The patient does not participate in regular exercise at present   Goals Addressed             This Visit's Progress    My goal is to make it to next year by maintaining my health, enjoying life & family.        Depression Screen    07/07/2022    3:24 PM 07/15/2021    9:02 AM 07/14/2020    8:04 AM 07/12/2019    8:11 AM 07/31/2018    1:26 PM 06/15/2017    1:45 PM 06/02/2016    9:00 AM  PHQ 2/9 Scores  PHQ - 2 Score 0 0 0 0 0 0 0    Fall Risk    07/07/2022    3:22 PM 07/15/2021    9:02 AM 07/14/2020    8:03 AM 07/12/2019    8:10 AM 07/31/2018    1:26 PM  [place] in the past year? 0 0 0 0 0  Number falls in past yr: 0 0     Injury with Fall? 0 0     Risk for fall due to : No Fall Risks      Follow up Falls prevention discussed  Falls evaluation completed Falls evaluation completed     [place]free:  Any stairs in or around the home? No  If so, are there any without handrails? No  Home free of loose throw rugs in walkways, pet beds, electrical cords, etc? Yes  Adequate lighting in your home to reduce risk of falls? Yes   ASSISTIVE DEVICES UTILIZED TO PREVENT FALLS:  Life alert? No  Use  of a cane, walker or w/c? No  Grab bars in the bathroom? No  Shower chair or bench in shower? No  Elevated toilet seat or a handicapped toilet? No   TIMED UP AND GO:  Was the test performed? No . Phone Visit  Cognitive Function:        07/07/2022    3:28 PM  6CIT Screen  What Year? 0 points  What month? 0 points  What time? 0 points  Count back from 20 0 points  Months in reverse 0 points  Repeat phrase 0 points  Total Score 0 points    Immunizations Immunization History   Administered Date(s) Administered   PNEUMOCOCCAL CONJUGATE-20 07/15/2021   Pneumococcal Conjugate-13 07/12/2019   Tdap 10/28/2011   Zoster Recombinat (Shingrix) 01/03/2018, 05/04/2018   Zoster, Live 07/05/2013    TDAP status: Due, Education has been provided regarding the importance of this vaccine. Advised may receive this vaccine at local pharmacy or Health Dept. Aware to provide a copy of the vaccination record if obtained from local pharmacy or Health Dept. Verbalized acceptance and understanding.  Flu Vaccine status: Declined, Education has been provided regarding the importance of this vaccine but patient still declined. Advised may receive this vaccine at local pharmacy or Health Dept. Aware to provide a copy of the vaccination record if obtained from local pharmacy or Health Dept. Verbalized acceptance and understanding.  Pneumococcal vaccine status: Up to date  Covid-19 vaccine status: Declined, Education has been provided regarding the importance of this vaccine but patient still declined. Advised may receive this vaccine at local pharmacy or Health Dept.or vaccine clinic. Aware to provide a copy of the vaccination record if obtained from local pharmacy or Health Dept. Verbalized acceptance and understanding.  Qualifies for Shingles Vaccine? Yes   Zostavax completed Yes   Shingrix Completed?: Yes  Screening Tests Health Maintenance  Topic Date Due   DTaP/Tdap/Td (2 - Td or Tdap) 10/27/2021   COLONOSCOPY (Pts 45-43yr Insurance coverage will need to be confirmed)  08/12/2022   Medicare Annual Wellness (AWV)  07/08/2023   Pneumonia Vaccine 67 Years old  Completed   Hepatitis C Screening  Completed   Zoster Vaccines- Shingrix  Completed   HPV VACCINES  Aged Out   INFLUENZA VACCINE  Discontinued   COVID-19 Vaccine  Discontinued    Health Maintenance  Health Maintenance Due  Topic Date Due   DTaP/Tdap/Td (2 - Td or Tdap) 10/27/2021    Colorectal cancer screening: Type  of screening: Colonoscopy. Completed 08/13/2019. Repeat every 3 years  Lung Cancer Screening: (Low Dose CT Chest recommended if Age 69-80years, 30 pack-year currently smoking OR have quit w/in 15years.) does not qualify.   Lung Cancer Screening Referral: no  Additional Screening:  Hepatitis C Screening: does qualify; Completed 04/09/2015  Vision Screening: Recommended annual ophthalmology exams for early detection of glaucoma and other disorders of the eye. Is the patient up to date with their annual eye exam?  Yes  Who is the provider or what is the name of the office in which the patient attends annual eye exams[place] If pt is not established with a provider, would they like to be referred to a provider to establish care? No .   Dental Screening: Recommended annual dental exams for proper oral hygiene  Community Resource Referral / Chronic Care Management: CRR required this visit?  No   CCM required this visit?  No  Plan:     I have personally reviewed and noted the following in the patient's chart:   Medical and social history Use of alcohol, tobacco or illicit drugs  Current medications and supplements including opioid prescriptions. Patient is not currently taking opioid prescriptions. Functional ability and status Nutritional status Physical activity Advanced directives List of other physicians Hospitalizations, surgeries, and ER visits in previous 12 months Vitals Screenings to include cognitive, depression, and falls Referrals and appointments  In addition, I have reviewed and discussed with patient certain preventive protocols, quality metrics, and best practice recommendations. A written personalized care plan for preventive services as well as general preventive health recommendations were provided to patient.     Sheral Flow, LPN   0/02/8109   Nurse Notes: N/A

## 2022-07-07 NOTE — Telephone Encounter (Signed)
It is possible.

## 2022-07-07 NOTE — Patient Instructions (Addendum)
Mr. Rodarte , Thank you for taking time to come for your Medicare Wellness Visit. I appreciate your ongoing commitment to your health goals. Please review the following plan we discussed and let me know if I can assist you in the future.   These are the goals we discussed:  Goals      My goal is to make it to next year by maintaining my health, enjoying life & family.        This is a list of the screening recommended for you and due dates:  Health Maintenance  Topic Date Due   DTaP/Tdap/Td vaccine (2 - Td or Tdap) 10/27/2021   Colon Cancer Screening  08/12/2022   Medicare Annual Wellness Visit  07/08/2023   Pneumonia Vaccine  Completed   Hepatitis C Screening: USPSTF Recommendation to screen - Ages 18-79 yo.  Completed   Zoster (Shingles) Vaccine  Completed   HPV Vaccine  Aged Out   Flu Shot  Discontinued   COVID-19 Vaccine  Discontinued    Advanced directives: Yes  Conditions/risks identified: Yes  Next appointment: Follow up in one year for your annual wellness visit.   Preventive Care 69 Years and Older, Male  Preventive care refers to lifestyle choices and visits with your health care provider that can promote health and wellness. What does preventive care include? A yearly physical exam. This is also called an annual well check. Dental exams once or twice a year. Routine eye exams. Ask your health care provider how often you should have your eyes checked. Personal lifestyle choices, including: Daily care of your teeth and gums. Regular physical activity. Eating a healthy diet. Avoiding tobacco and drug use. Limiting alcohol use. Practicing safe sex. Taking low doses of aspirin every day. Taking vitamin and mineral supplements as recommended by your health care provider. What happens during an annual well check? The services and screenings done by your health care provider during your annual well check will depend on your age, overall health, lifestyle risk  factors, and family history of disease. Counseling  Your health care provider may ask you questions about your: Alcohol use. Tobacco use. Drug use. Emotional well-being. Home and relationship well-being. Sexual activity. Eating habits. History of falls. Memory and ability to understand (cognition). Work and work Statistician. Screening  You may have the following tests or measurements: Height, weight, and BMI. Blood pressure. Lipid and cholesterol levels. These may be checked every 5 years, or more frequently if you are over 26 years old. Skin check. Lung cancer screening. You may have this screening every year starting at age 69 if you have a 30-pack-year history of smoking and currently smoke or have quit within the past 15 years. Fecal occult blood test (FOBT) of the stool. You may have this test every year starting at age 69. Flexible sigmoidoscopy or colonoscopy. You may have a sigmoidoscopy every 5 years or a colonoscopy every 10 years starting at age 69. Prostate cancer screening. Recommendations will vary depending on your family history and other risks. Hepatitis C blood test. Hepatitis B blood test. Sexually transmitted disease (STD) testing. Diabetes screening. This is done by checking your blood sugar (glucose) after you have not eaten for a while (fasting). You may have this done every 1-3 years. Abdominal aortic aneurysm (AAA) screening. You may need this if you are a current or former smoker. Osteoporosis. You may be screened starting at age 69 if you are at high risk. Talk with your health care provider about  your test results, treatment options, and if necessary, the need for more tests. Vaccines  Your health care provider may recommend certain vaccines, such as: Influenza vaccine. This is recommended every year. Tetanus, diphtheria, and acellular pertussis (Tdap, Td) vaccine. You may need a Td booster every 10 years. Zoster vaccine. You may need this after age  69. Pneumococcal 13-valent conjugate (PCV13) vaccine. One dose is recommended after age 69. Pneumococcal polysaccharide (PPSV23) vaccine. One dose is recommended after age 69. Talk to your health care provider about which screenings and vaccines you need and how often you need them. This information is not intended to replace advice given to you by your health care provider. Make sure you discuss any questions you have with your health care provider. Document Released: 07/18/2015 Document Revised: 03/10/2016 Document Reviewed: 04/22/2015 Elsevier Interactive Patient Education  2017 [place] Prevention in the Home Falls can cause injuries. They can happen to people of all ages. There are many things you can do to make your home safe and to help prevent falls. What can I do on the outside of my home? Regularly fix the edges of walkways and driveways and fix any cracks. Remove anything that might make you trip as you walk through a door, such as a raised step or threshold. Trim any bushes or trees on the path to your home. Use bright outdoor lighting. Clear any walking paths of anything that might make someone trip, such as rocks or tools. Regularly check to see if handrails are loose or broken. Make sure that both sides of any steps have handrails. Any raised decks and porches should have guardrails on the edges. Have any leaves, snow, or ice cleared regularly. Use sand or salt on walking paths during winter. Clean up any spills in your garage right away. This includes oil or grease spills. What can I do in the bathroom? Use night lights. Install grab bars by the toilet and in the tub and shower. Do not use towel bars as grab bars. Use non-skid mats or decals in the tub or shower. If you need to sit down in the shower, use a plastic, non-slip stool. Keep the floor dry. Clean up any water that spills on the floor as soon as it happens. Remove soap buildup in the tub or shower  regularly. Attach bath mats securely with double-sided non-slip rug tape. Do not have throw rugs and other things on the floor that can make you trip. What can I do in the bedroom? Use night lights. Make sure that you have a light by your bed that is easy to reach. Do not use any sheets or blankets that are too big for your bed. They should not hang down onto the floor. Have a firm chair that has side arms. You can use this for support while you get dressed. Do not have throw rugs and other things on the floor that can make you trip. What can I do in the kitchen? Clean up any spills right away. Avoid walking on wet floors. Keep items that you use a lot in easy-to-reach places. If you need to reach something above you, use a strong step stool that has a grab bar. Keep electrical cords out of the way. Do not use floor polish or wax that makes floors slippery. If you must use wax, use non-skid floor wax. Do not have throw rugs and other things on the floor that can make you trip. What can I do with  my stairs? Do not leave any items on the stairs. Make sure that there are handrails on both sides of the stairs and use them. Fix handrails that are broken or loose. Make sure that handrails are as long as the stairways. Check any carpeting to make sure that it is firmly attached to the stairs. Fix any carpet that is loose or worn. Avoid having throw rugs at the top or bottom of the stairs. If you do have throw rugs, attach them to the floor with carpet tape. Make sure that you have a light switch at the top of the stairs and the bottom of the stairs. If you do not have them, ask someone to add them for you. What else can I do to help prevent falls? Wear shoes that: Do not have high heels. Have rubber bottoms. Are comfortable and fit you well. Are closed at the toe. Do not wear sandals. If you use a stepladder: Make sure that it is fully opened. Do not climb a closed stepladder. Make sure that  both sides of the stepladder are locked into place. Ask someone to hold it for you, if possible. Clearly mark and make sure that you can see: Any grab bars or handrails. First and last steps. Where the edge of each step is. Use tools that help you move around (mobility aids) if they are needed. These include: Canes. Walkers. Scooters. Crutches. Turn on the lights when you go into a dark area. Replace any light bulbs as soon as they burn out. Set up your furniture so you have a clear path. Avoid moving your furniture around. If any of your floors are uneven, fix them. If there are any pets around you, be aware of where they are. Review your medicines with your doctor. Some medicines can make you feel dizzy. This can increase your chance of falling. Ask your doctor what other things that you can do to help prevent falls. This information is not intended to replace advice given to you by your health care provider. Make sure you discuss any questions you have with your health care provider. Document Released: 04/17/2009 Document Revised: 11/27/2015 Document Reviewed: 07/26/2014 Elsevier Interactive Patient Education  2017 [employer].

## 2022-07-07 NOTE — Telephone Encounter (Signed)
Patient stated that he periodically at night gets cramps in his legs, especially in the groin and thigh area.  Not sure what it could be.  Could it be he is not staying hydrated enough?

## 2022-07-08 NOTE — Telephone Encounter (Signed)
Called patient and informed him of provider response

## 2022-07-21 ENCOUNTER — Encounter: Payer: Self-pay | Admitting: Emergency Medicine

## 2022-07-21 ENCOUNTER — Ambulatory Visit (INDEPENDENT_AMBULATORY_CARE_PROVIDER_SITE_OTHER): Payer: PPO | Admitting: Emergency Medicine

## 2022-07-21 VITALS — BP 128/70 | HR 63 | Temp 97.6°F | Ht 73.0 in | Wt 240.1 lb

## 2022-07-21 DIAGNOSIS — Z1329 Encounter for screening for other suspected endocrine disorder: Secondary | ICD-10-CM | POA: Diagnosis not present

## 2022-07-21 DIAGNOSIS — Z13220 Encounter for screening for lipoid disorders: Secondary | ICD-10-CM

## 2022-07-21 DIAGNOSIS — Z13228 Encounter for screening for other metabolic disorders: Secondary | ICD-10-CM

## 2022-07-21 DIAGNOSIS — N5203 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 dysfunction: Secondary | ICD-10-CM | POA: Diagnosis not present

## 2022-07-21 DIAGNOSIS — F5102 Adjustment insomnia: Secondary | ICD-10-CM | POA: Diagnosis not present

## 2022-07-21 DIAGNOSIS — I10 Essential (primary) hypertension: Secondary | ICD-10-CM

## 2022-07-21 DIAGNOSIS — Z125 Encounter for screening for malignant neoplasm of prostate: Secondary | ICD-10-CM

## 2022-07-21 DIAGNOSIS — N289 Disorder of kidney and ureter, unspecified: Secondary | ICD-10-CM

## 2022-07-21 DIAGNOSIS — Z0000 Encounter for general adult medical examination without abnormal findings: Secondary | ICD-10-CM

## 2022-07-21 DIAGNOSIS — Z130 Encounter for screening for diseases of the blood and blood-forming organs and certain disorders involving the immune mechanism: Secondary | ICD-10-CM | POA: Diagnosis not present

## 2022-07-21 LAB — CBC WITH DIFFERENTIAL/PLATELET
Basophils Absolute: 0.1 10*3/uL (ref 0.0–0.1)
Basophils Relative: 0.7 % (ref 0.0–3.0)
Eosinophils Absolute: 0.1 10*3/uL (ref 0.0–0.7)
Eosinophils Relative: 1.7 % (ref 0.0–5.0)
HCT: 47.9 % (ref 39.0–52.0)
Hemoglobin: 16.3 g/dL (ref 13.0–17.0)
Lymphocytes Relative: 25.5 % (ref 12.0–46.0)
Lymphs Abs: 2 10*3/uL (ref 0.7–4.0)
MCHC: 34.1 g/dL (ref 30.0–36.0)
MCV: 91.7 fl (ref 78.0–100.0)
Monocytes Absolute: 0.9 10*3/uL (ref 0.1–1.0)
Monocytes Relative: 11.7 % (ref 3.0–12.0)
Neutro Abs: 4.7 10*3/uL (ref 1.4–7.7)
Neutrophils Relative %: 60.4 % (ref 43.0–77.0)
Platelets: 246 10*3/uL (ref 150.0–400.0)
RBC: 5.22 Mil/uL (ref 4.22–5.81)
RDW: 13.5 % (ref 11.5–15.5)
WBC: 7.9 10*3/uL (ref 4.0–10.5)

## 2022-07-21 LAB — HEMOGLOBIN A1C: Hgb A1c MFr Bld: 5.8 % (ref 4.6–6.5)

## 2022-07-21 LAB — COMPREHENSIVE METABOLIC PANEL
ALT: 18 U/L (ref 0–53)
AST: 17 U/L (ref 0–37)
Albumin: 4.3 g/dL (ref 3.5–5.2)
Alkaline Phosphatase: 50 U/L (ref 39–117)
BUN: 19 mg/dL (ref 6–23)
CO2: 31 mEq/L (ref 19–32)
Calcium: 9.7 mg/dL (ref 8.4–10.5)
Chloride: 103 mEq/L (ref 96–112)
Creatinine, Ser: 1.47 mg/dL (ref 0.40–1.50)
GFR: 48.66 mL/min — ABNORMAL LOW (ref >60.00)
Glucose, Bld: 104 mg/dL — ABNORMAL HIGH (ref 70–99)
Potassium: 5.1 mEq/L (ref 3.5–5.1)
Sodium: 141 mEq/L (ref 135–145)
Total Bilirubin: 0.7 mg/dL (ref 0.2–1.2)
Total Protein: 6.8 g/dL (ref 6.0–8.3)

## 2022-07-21 LAB — LIPID PANEL
Cholesterol: 176 mg/dL (ref 0–200)
HDL: 53.8 mg/dL (ref >39.00)
LDL Cholesterol: 93 mg/dL (ref 0–99)
NonHDL: 122.23
Total CHOL/HDL Ratio: 3
Triglycerides: 144 mg/dL (ref 0.0–149.0)
VLDL: 28.8 mg/dL (ref 0.0–40.0)

## 2022-07-21 LAB — PSA: PSA: 1.08 ng/mL (ref 0.10–4.00)

## 2022-07-21 MED ORDER — ZOLPIDEM TARTRATE 10 MG PO TABS: 10.0000 mg | ORAL_TABLET | Freq: Every evening | ORAL | 5 refills | Status: AC | PRN

## 2022-07-21 MED ORDER — IRBESARTAN 150 MG PO TABS: 150.0000 mg | ORAL_TABLET | Freq: Every day | ORAL | 3 refills | Status: DC

## 2022-07-21 MED ORDER — TADALAFIL 20 MG PO TABS: 20.0000 mg | ORAL_TABLET | Freq: Every day | ORAL | 11 refills | Status: AC | PRN

## 2022-07-21 NOTE — Assessment & Plan Note (Signed)
Elevated blood pressure reading in the office.  Normal blood pressure readings at home. Continue irbesartan 150 mg daily. Cardiovascular risk associated with hypertension discussed. Dietary approaches to stop hypertension discussed.

## 2022-07-21 NOTE — Patient Instructions (Signed)
Health Maintenance, Male Adopting a healthy lifestyle and getting preventive care are important in promoting health and wellness. Ask your health care provider about: The right schedule for you to have regular tests and exams. Things you can do on your own to prevent diseases and keep yourself healthy. What should I know about diet, weight, and exercise? Eat a healthy diet  Eat a diet that includes plenty of vegetables, fruits, low-fat dairy products, and lean protein. Do not eat a lot of foods that are high in solid fats, added sugars, or sodium. Maintain a healthy weight Body mass index (BMI) is a measurement that can be used to identify possible weight problems. It estimates body fat based on height and weight. Your health care provider can help determine your BMI and help you achieve or maintain a healthy weight. Get regular exercise Get regular exercise. This is one of the most important things you can do for your health. Most adults should: Exercise for at least 150 minutes each week. The exercise should increase your heart rate and make you sweat (moderate-intensity exercise). Do strengthening exercises at least twice a week. This is in addition to the moderate-intensity exercise. Spend less time sitting. Even light physical activity can be beneficial. Watch cholesterol and blood lipids Have your blood tested for lipids and cholesterol at 69 years of age, then have this test every 5 years. You may need to have your cholesterol levels checked more often if: Your lipid or cholesterol levels are high. You are older than 69 years of age. You are at high risk for heart disease. What should I know about cancer screening? Many types of cancers can be detected early and may often be prevented. Depending on your health history and family history, you may need to have cancer screening at various ages. This may include screening for: Colorectal cancer. Prostate cancer. Skin cancer. Lung  cancer. What should I know about heart disease, diabetes, and high blood pressure? Blood pressure and heart disease High blood pressure causes heart disease and increases the risk of stroke. This is more likely to develop in people who have high blood pressure readings or are overweight. Talk with your health care provider about your target blood pressure readings. Have your blood pressure checked: Every 3-5 years if you are 18-39 years of age. Every year if you are 40 years old or older. If you are between the ages of 65 and 75 and are a current or former smoker, ask your health care provider if you should have a one-time screening for abdominal aortic aneurysm (AAA). Diabetes Have regular diabetes screenings. This checks your fasting blood sugar level. Have the screening done: Once every three years after age 45 if you are at a normal weight and have a low risk for diabetes. More often and at a younger age if you are overweight or have a high risk for diabetes. What should I know about preventing infection? Hepatitis B If you have a higher risk for hepatitis B, you should be screened for this virus. Talk with your health care provider to find out if you are at risk for hepatitis B infection. Hepatitis C Blood testing is recommended for: Everyone born from 1945 through 1965. Anyone with known risk factors for hepatitis C. Sexually transmitted infections (STIs) You should be screened each year for STIs, including gonorrhea and chlamydia, if: You are sexually active and are younger than 69 years of age. You are older than 69 years of age and your   health care provider tells you that you are at risk for this type of infection. Your sexual activity has changed since you were last screened, and you are at increased risk for chlamydia or gonorrhea. Ask your health care provider if you are at risk. Ask your health care provider about whether you are at high risk for HIV. Your health care provider  may recommend a prescription medicine to help prevent HIV infection. If you choose to take medicine to prevent HIV, you should first get tested for HIV. You should then be tested every 3 months for as long as you are taking the medicine. Follow these instructions at home: Alcohol use Do not drink alcohol if your health care provider tells you not to drink. If you drink alcohol: Limit how much you have to 0-2 drinks a day. Know how much alcohol is in your drink. In the [place] one drink equals one 12 oz bottle of beer (355 mL), one 5 oz glass of wine (148 mL), or one 1 oz glass of hard liquor (44 mL). Lifestyle Do not use any products that contain nicotine or tobacco. These products include cigarettes, chewing tobacco, and vaping devices, such as e-cigarettes. If you need help quitting, ask your health care provider. Do not use street drugs. Do not share needles. Ask your health care provider for help if you need support or information about quitting drugs. General instructions Schedule regular health, dental, and eye exams. Stay current with your vaccines. Tell your health care provider if: You often feel depressed. You have ever been abused or do not feel safe at home. Summary Adopting a healthy lifestyle and getting preventive care are important in promoting health and wellness. Follow your health care provider's instructions about healthy diet, exercising, and getting tested or screened for diseases. Follow your health care provider's instructions on monitoring your cholesterol and blood pressure. This information is not intended to replace advice given to you by your health care provider. Make sure you discuss any questions you have with your health care provider. Document Revised: 11/10/2020 Document Reviewed: 11/10/2020 Elsevier Patient Education  2023 Elsevier Inc.  

## 2022-07-21 NOTE — Assessment & Plan Note (Signed)
Stable.  Advised to stay well-hydrated and avoid NSAIDs.

## 2022-07-21 NOTE — Progress Notes (Signed)
Jorge Barker 67 y.o.   Chief Complaint  Patient presents with   Annual Exam    No concerns     HISTORY OF PRESENT ILLNESS: This is a 67 y.o. male here for annual exam. History of hypertension on irbesartan.  Normal blood pressure readings at home. Overall doing well.  Has no complaints or any other medical concerns today.  HPI   Prior to Admission medications   Medication Sig Start Date End Date Taking? Authorizing Provider  acetaminophen (TYLENOL) 500 MG tablet Take 500 mg by mouth every 6 (six) hours as needed.   Yes [provider]  acyclovir (ZOVIRAX) 400 MG tablet Take 400 mg by mouth 3 (three) times daily. As needed 04/05/17  Yes [provider]  irbesartan (AVAPRO) 150 MG tablet TAKE 1 TABLET BY MOUTH EVERY DAY 09/01/21  Yes Zeinab Rodwell, Ines Bloomer, MD  tadalafil (CIALIS) 20 MG tablet Take 1 tablet (20 mg total) by mouth daily as needed. 07/15/21  Yes Mallary Kreger, Ines Bloomer, MD  zolpidem (AMBIEN) 10 MG tablet Take 1 tablet (10 mg total) by mouth at bedtime as needed for sleep. 07/16/20 07/15/21  Horald Pollen, MD    Allergies  Allergen Reactions   Citalopram Hydrobromide     Felt hung-over   Influenza Vaccines     Serum sickness like illness   Oxycodone-Acetaminophen     REACTION: severe nausea and vomiting   Percocet [Oxycodone-Acetaminophen]     Patient Active Problem List   Diagnosis Date Noted   BMI 29.0-29.9,adult 06/21/2017   Sleep apnea with use of continuous positive airway pressure (CPAP) 06/04/2013   Hypersomnia with sleep apnea, unspecified 06/04/2013   Erectile dysfunction    Snoring    HBP (high blood pressure) 02/03/2012   Renal insufficiency 02/03/2012   Insomnia 02/03/2012    Past Medical History:  Diagnosis Date   Allergy    Arthritis    RIGHT shoulder OA   Chronic renal insufficiency, stage 2 (mild)    [name] every six months.   Erectile dysfunction    Hypertension    Insomnia    Sleep apnea    wears cpap     Sleep apnea with use of continuous positive airway pressure (CPAP) 06/04/2013   Snoring     Past Surgical History:  Procedure Laterality Date   COLONOSCOPY     HEMORRHOID SURGERY N/A 09/19/2019   Procedure: EXCISION OF ANAL POLYP;  Surgeon: Coralie Keens, MD;  Location: [facility];  Service: General;  Laterality: N/A;   KNEE ARTHROSCOPY Left    x2   POLYPECTOMY     thumb surgery Left    TONSILLECTOMY     as child     Social History   Socioeconomic History   Marital status: Married    Spouse name: [name]   Number of children: 2   Years of education: Assoc.   Highest education level: Not on file  Occupational History   Occupation: drive bus part time    Employer: RETIRED  Tobacco Use   Smoking status: Former    Years: 12.00    Types: Cigarettes    Quit date: 07/05/1981    Years since quitting: 41.0   Smokeless tobacco: Never  Substance and Sexual Activity   Alcohol use: Yes    Comment: drinks beer and spirit (RUM)/10oz weekly   Drug use: No   Sexual activity: Yes    Birth control/protection: Post-menopausal  Other Topics Concern   Not on file  Social History Narrative   Patient is married ([name]) and lives at home with his wife.  Married x 1977.   Patient has two adult children.  Four grandchildren.      Lives: with wife.   Married. Education: [employer]. Exercise: Walk daily 30-60 minutes. Patient is right handed and Consumes 1-2 cups of caffiene daily.      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       Tobacco; quit 35 years ago; smoked for 85-28 yo.      Alcohol: 3-7drinks per week       Exercise:  None in 2018; active in yard; yard is 1 acre.                    Social Determinants of Health   Financial Resource Strain: Low Risk  (07/07/2022)   Overall Financial Resource Strain (CARDIA)    Difficulty of Paying Living  Expenses: Not hard at all  Food Insecurity: No Food Insecurity (07/07/2022)   Hunger Vital Sign    Worried About Running Out of Food in the Last Year: Never true    Ran Out of Food in the Last Year: Never true  Transportation Needs: No Transportation Needs (07/07/2022)   PRAPARE - Hydrologist (Medical): No    Lack of Transportation (Non-Medical): No  Physical Activity: Inactive (07/07/2022)   Exercise Vital Sign    Days of Exercise per Week: 0 days    Minutes of Exercise per Session: 0 min  Stress: No Stress Concern Present (07/07/2022)   [place]    Feeling of Stress : Not at all  Social Connections: [place] (07/07/2022)   Social Connection and Isolation Panel [NHANES]    Frequency of Communication with Friends and Family: More than three times a week    Frequency of Social Gatherings with Friends and Family: More than three times a week    Attends Religious Services: More than 4 times per year    Active Member of [employer] or Organizations: Yes    Attends Music therapist: More than 4 times per year    Marital Status: Married  Human resources officer Violence: Not At Risk (07/07/2022)   Humiliation, Afraid, Rape, and Kick questionnaire    Fear of Current or Ex-Partner: No    Emotionally Abused: No    Physically Abused: No    Sexually Abused: No    Family History  Problem Relation Age of Onset   Cancer Mother        intestinal cancer/abdomen- pt unsure what cancer mother actually had    Colon polyps Father    Colon cancer Neg Hx    Esophageal cancer Neg Hx    Rectal cancer Neg Hx    Stomach cancer Neg Hx      Review of Systems  Constitutional: Negative.  Negative for chills and fever.  HENT: Negative.  Negative for congestion and sore throat.   Eyes: Negative.   Respiratory: Negative.  Negative for cough and shortness of breath.   Cardiovascular: Negative.  Negative for  chest pain and palpitations.  Gastrointestinal: Negative.  Negative for abdominal pain, diarrhea, nausea and vomiting.  Genitourinary: Negative.  Negative for dysuria and hematuria.  Skin: Negative.  Negative for rash.  Neurological: Negative.  Negative  for dizziness and headaches.  All other systems reviewed and are negative.  Today's Vitals   07/21/22 0858  BP: (!) 162/90  Pulse: 63  Temp: 97.6 F (36.4 C)  TempSrc: Oral  SpO2: 97%  Weight: 240 lb 2 oz (108.9 kg)  Height: '6\' 1"'$  (1.854 m)   Body mass index is 31.68 kg/m.   Physical Exam Vitals reviewed.  Constitutional:      Appearance: Normal appearance.  HENT:     Head: Normocephalic.     Right Ear: Tympanic membrane, ear canal and external ear normal.     Left Ear: Tympanic membrane, ear canal and external ear normal.     Mouth/Throat:     Mouth: Mucous membranes are moist.     Pharynx: Oropharynx is clear.  Eyes:     Extraocular Movements: Extraocular movements intact.     Conjunctiva/sclera: Conjunctivae normal.     Pupils: Pupils are equal, round, and reactive to light.  Cardiovascular:     Rate and Rhythm: Normal rate and regular rhythm.     Pulses: Normal pulses.     Heart sounds: Normal heart sounds.  Pulmonary:     Effort: Pulmonary effort is normal.     Breath sounds: Normal breath sounds.  Abdominal:     General: There is no distension.     Palpations: Abdomen is soft.     Tenderness: There is no abdominal tenderness.  Musculoskeletal:     Cervical back: No tenderness.     Right lower leg: No edema.     Left lower leg: No edema.  Lymphadenopathy:     Cervical: No cervical adenopathy.  Skin:    General: Skin is warm and dry.  Neurological:     General: No focal deficit present.     Mental Status: He is alert and oriented to person, place, and time.  Psychiatric:        Mood and Affect: Mood normal.        Behavior: Behavior normal.      ASSESSMENT & PLAN: Problem List Items Addressed This  Visit       Cardiovascular and Mediastinum   HBP (high blood pressure)    Elevated blood pressure reading in the office.  Normal blood pressure readings at home. Continue irbesartan 150 mg daily. Cardiovascular risk associated with hypertension discussed. Dietary approaches to stop hypertension discussed.      Relevant Medications   tadalafil (CIALIS) 20 MG tablet   irbesartan (AVAPRO) 150 MG tablet   Other Relevant Orders   CBC with Differential   Comprehensive metabolic panel     Genitourinary   Renal insufficiency    Stable.  Advised to stay well-hydrated and avoid NSAIDs.      Relevant Orders   CBC with Differential   Comprehensive metabolic panel     Other   Insomnia   Relevant Medications   zolpidem (AMBIEN) 10 MG tablet   Erectile dysfunction   Relevant Medications   tadalafil (CIALIS) 20 MG tablet   Other Visit Diagnoses     Routine general medical examination at a health care facility    -  Primary   Relevant Orders   CBC with Differential   Comprehensive metabolic panel   Hemoglobin A1c   Lipid panel   PSA(Must document that pt has been informed of limitations of PSA testing.)   Prostate cancer screening       Relevant Orders   PSA(Must document that pt has been informed of limitations  of PSA testing.)   Screening for deficiency anemia       Relevant Orders   CBC with Differential   Screening for lipoid disorders       Relevant Orders   Lipid panel   Screening for endocrine, metabolic and immunity disorder       Relevant Orders   Hemoglobin A1c      Modifiable risk factors discussed with patient. Anticipatory guidance according to age provided. The following topics were also discussed: Social Determinants of Health Smoking.  Non-smoker Diet and nutrition and need to decrease amount of daily carbohydrate intake and daily calories and increase amount of plant based protein in his diet Benefits of exercise Cancer screening and review of most  recent colonoscopy report done in 2021 Vaccinations reviewed and recommendations Cardiovascular risk assessment The 10-year ASCVD risk score (Arnett DK, et al., 2019) is: 14.4%   Values used to calculate the score:     Age: 75 years     Sex: Male     Is Non-Hispanic African American: No     Diabetic: No     Tobacco smoker: No     Systolic Blood Pressure: 073 mmHg     Is BP treated: Yes     HDL Cholesterol: 63.7 mg/dL     Total Cholesterol: 161 mg/dL Review of chronic medical problems Review of all medications Mental health including depression and anxiety Fall and accident prevention  Patient Instructions  Health Maintenance, Male Adopting a healthy lifestyle and getting preventive care are important in promoting health and wellness. Ask your health care provider about: The right schedule for you to have regular tests and exams. Things you can do on your own to prevent diseases and keep yourself healthy. What should I know about diet, weight, and exercise? Eat a healthy diet  Eat a diet that includes plenty of vegetables, fruits, low-fat dairy products, and lean protein. Do not eat a lot of foods that are high in solid fats, added sugars, or sodium. Maintain a healthy weight Body mass index (BMI) is a measurement that can be used to identify possible weight problems. It estimates body fat based on height and weight. Your health care provider can help determine your BMI and help you achieve or maintain a healthy weight. Get regular exercise Get regular exercise. This is one of the most important things you can do for your health. Most adults should: Exercise for at least 150 minutes each week. The exercise should increase your heart rate and make you sweat (moderate-intensity exercise). Do strengthening exercises at least twice a week. This is in addition to the moderate-intensity exercise. Spend less time sitting. Even light physical activity can be beneficial. Watch cholesterol  and blood lipids Have your blood tested for lipids and cholesterol at 69 years of age, then have this test every 5 years. You may need to have your cholesterol levels checked more often if: Your lipid or cholesterol levels are high. You are older than 69 years of age. You are at high risk for heart disease. What should I know about cancer screening? Many types of cancers can be detected early and may often be prevented. Depending on your health history and family history, you may need to have cancer screening at various ages. This may include screening for: Colorectal cancer. Prostate cancer. Skin cancer. Lung cancer. What should I know about heart disease, diabetes, and high blood pressure? Blood pressure and heart disease High blood pressure causes heart disease and increases  the risk of stroke. This is more likely to develop in people who have high blood pressure readings or are overweight. Talk with your health care provider about your target blood pressure readings. Have your blood pressure checked: Every 3-5 years if you are 47-88 years of age. Every year if you are 46 years old or older. If you are between the ages of 11 and 14 and are a current or former smoker, ask your health care provider if you should have a one-time screening for abdominal aortic aneurysm (AAA). Diabetes Have regular diabetes screenings. This checks your fasting blood sugar level. Have the screening done: Once every three years after age 86 if you are at a normal weight and have a low risk for diabetes. More often and at a younger age if you are overweight or have a high risk for diabetes. What should I know about preventing infection? Hepatitis B If you have a higher risk for hepatitis B, you should be screened for this virus. Talk with your health care provider to find out if you are at risk for hepatitis B infection. Hepatitis C Blood testing is recommended for: Everyone born from [birth year] through  1965. Anyone with known risk factors for hepatitis C. Sexually transmitted infections (STIs) You should be screened each year for STIs, including gonorrhea and chlamydia, if: You are sexually active and are younger than 69 years of age. You are older than 69 years of age and your health care provider tells you that you are at risk for this type of infection. Your sexual activity has changed since you were last screened, and you are at increased risk for chlamydia or gonorrhea. Ask your health care provider if you are at risk. Ask your health care provider about whether you are at high risk for HIV. Your health care provider may recommend a prescription medicine to help prevent HIV infection. If you choose to take medicine to prevent HIV, you should first get tested for HIV. You should then be tested every 3 months for as long as you are taking the medicine. Follow these instructions at home: Alcohol use Do not drink alcohol if your health care provider tells you not to drink. If you drink alcohol: Limit how much you have to 0-2 drinks a day. Know how much alcohol is in your drink. In the [place] one drink equals one 12 oz bottle of beer (355 mL), one 5 oz glass of wine (148 mL), or one 1 oz glass of hard liquor (44 mL). Lifestyle Do not use any products that contain nicotine or tobacco. These products include cigarettes, chewing tobacco, and vaping devices, such as e-cigarettes. If you need help quitting, ask your health care provider. Do not use street drugs. Do not share needles. Ask your health care provider for help if you need support or information about quitting drugs. General instructions Schedule regular health, dental, and eye exams. Stay current with your vaccines. Tell your health care provider if: You often feel depressed. You have ever been abused or do not feel safe at home. Summary Adopting a healthy lifestyle and getting preventive care are important in promoting health and  wellness. Follow your health care provider's instructions about healthy diet, exercising, and getting tested or screened for diseases. Follow your health care provider's instructions on monitoring your cholesterol and blood pressure. This information is not intended to replace advice given to you by your health care provider. Make sure you discuss any questions you have with your health  care provider. Document Revised: 11/10/2020 Document Reviewed: 11/10/2020 Elsevier Patient Education  [place], MD [facility]

## 2022-09-03 ENCOUNTER — Other Ambulatory Visit: Payer: Self-pay | Admitting: Emergency Medicine

## 2022-10-08 ENCOUNTER — Encounter: Payer: Self-pay | Admitting: Gastroenterology

## 2023-06-14 ENCOUNTER — Telehealth: Payer: Self-pay | Admitting: Emergency Medicine

## 2023-06-14 NOTE — Telephone Encounter (Signed)
Contacted Jorge Barker to schedule their annual wellness visit. Patient declined to schedule AWV at this time.Transferred care to beach. Patient didn't provide new PCP information.  [facility] Care Guide [facility] AWV TEAM Direct Dial: [phone]
